# Patient Record
Sex: Female | Born: 1995 | Race: Black or African American | Hispanic: No | Marital: Married | Smoking: Never smoker
Health system: Southern US, Community
[De-identification: ages and names within clinical notes are randomized; demographics above are authoritative.]

## PROBLEM LIST (undated history)

## (undated) DIAGNOSIS — R55 Syncope and collapse: Secondary | ICD-10-CM

## (undated) DIAGNOSIS — Z3490 Encounter for supervision of normal pregnancy, unspecified, unspecified trimester: Secondary | ICD-10-CM

## (undated) HISTORY — PX: NO PAST SURGERIES: SHX2092

---
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
## 2014-03-06 ENCOUNTER — Other Ambulatory Visit: Payer: Self-pay | Admitting: Obstetrics and Gynecology

## 2014-03-06 DIAGNOSIS — N649 Disorder of breast, unspecified: Secondary | ICD-10-CM

## 2014-03-12 ENCOUNTER — Ambulatory Visit
Admission: RE | Admit: 2014-03-12 | Discharge: 2014-03-12 | Disposition: A | Discharge status: Home / Self Care | Source: Ambulatory Visit | Attending: Obstetrics and Gynecology | Admitting: Obstetrics and Gynecology

## 2014-03-12 DIAGNOSIS — N649 Disorder of breast, unspecified: Secondary | ICD-10-CM

## 2014-09-19 IMAGING — CR DG CHEST 2V
2 series · 2 of 2 positions shown · non-contrast
Comparison: 07/06/2011

CLINICAL DATA: Fever and body aches

EXAM:
CHEST  2 VIEW

[w chest pa]
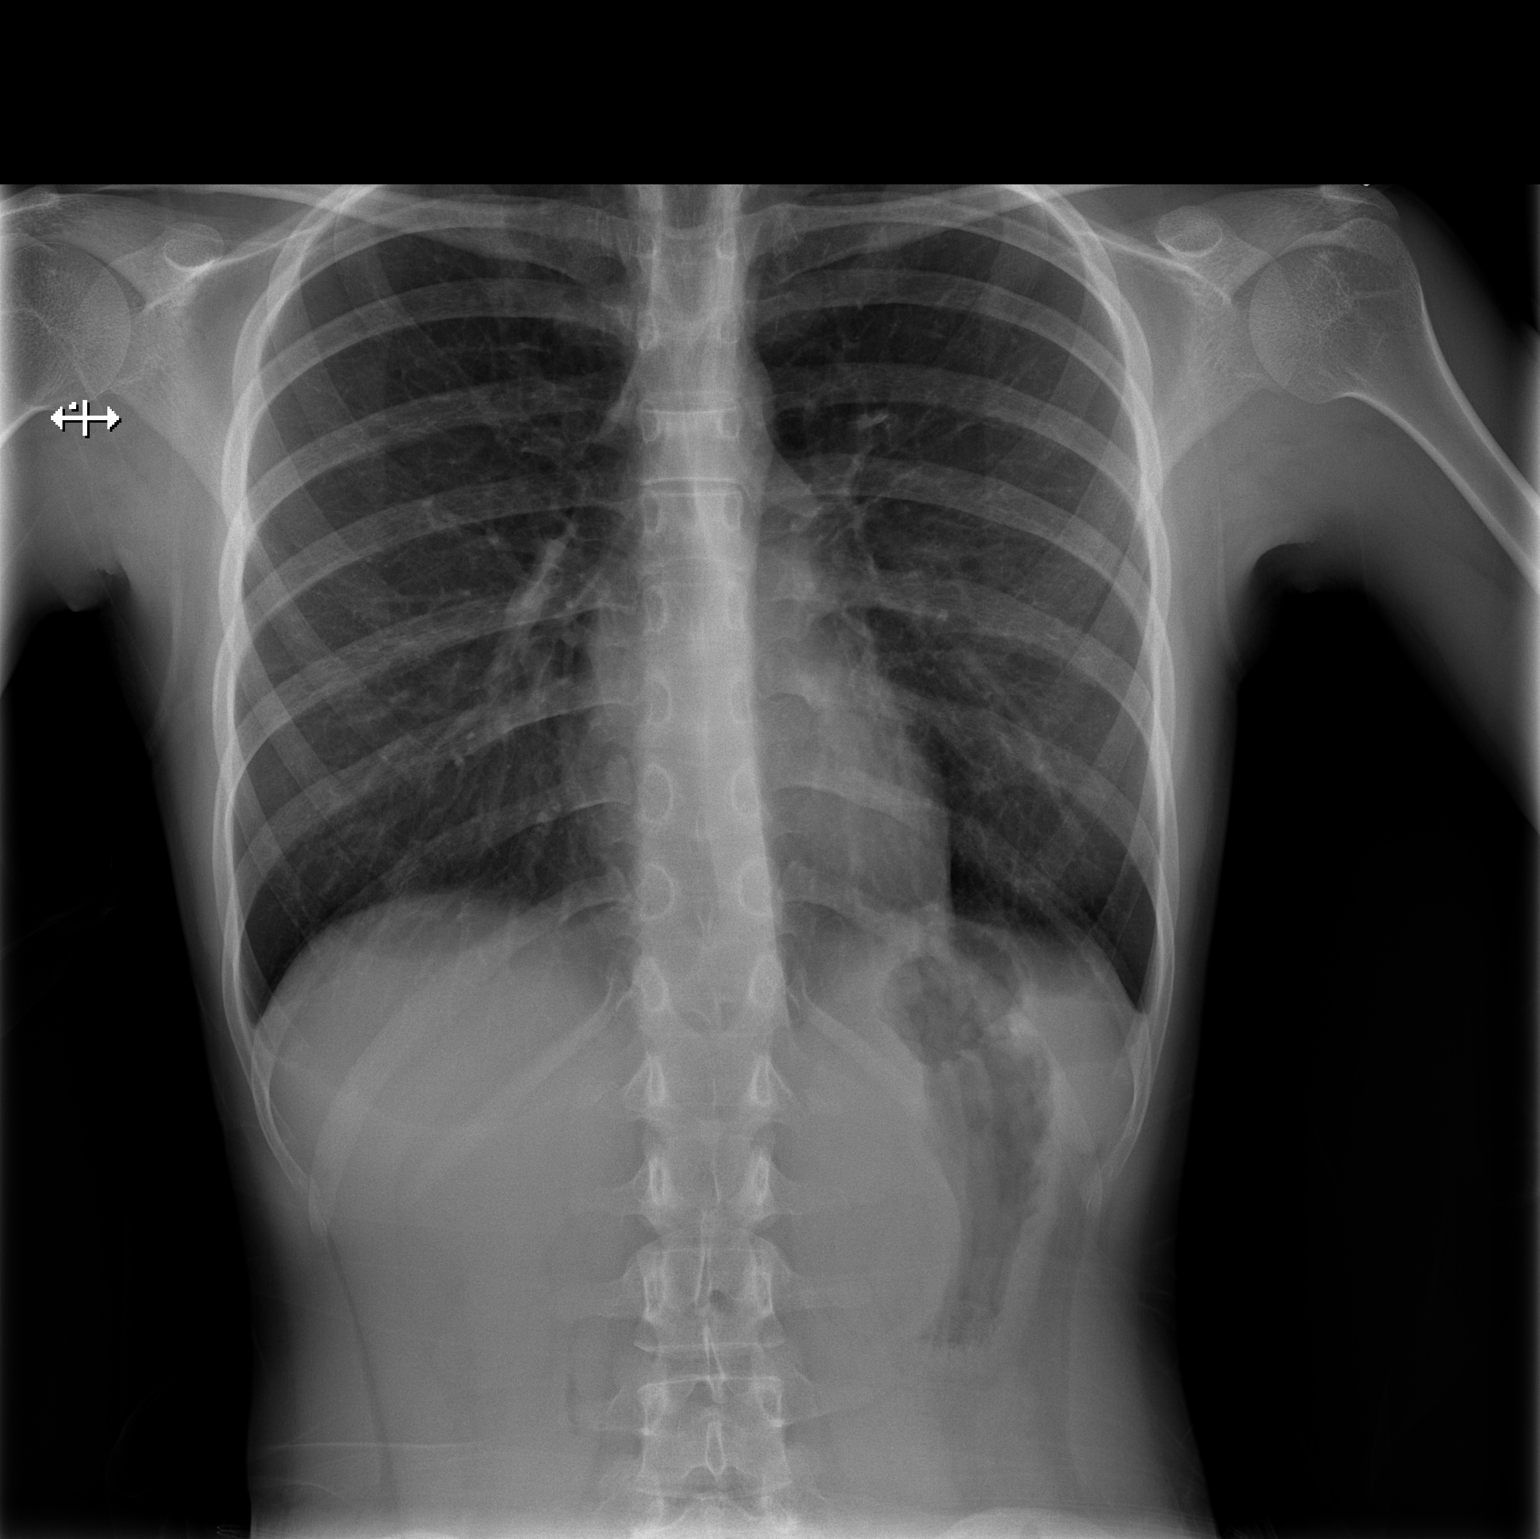

[w chest lat]
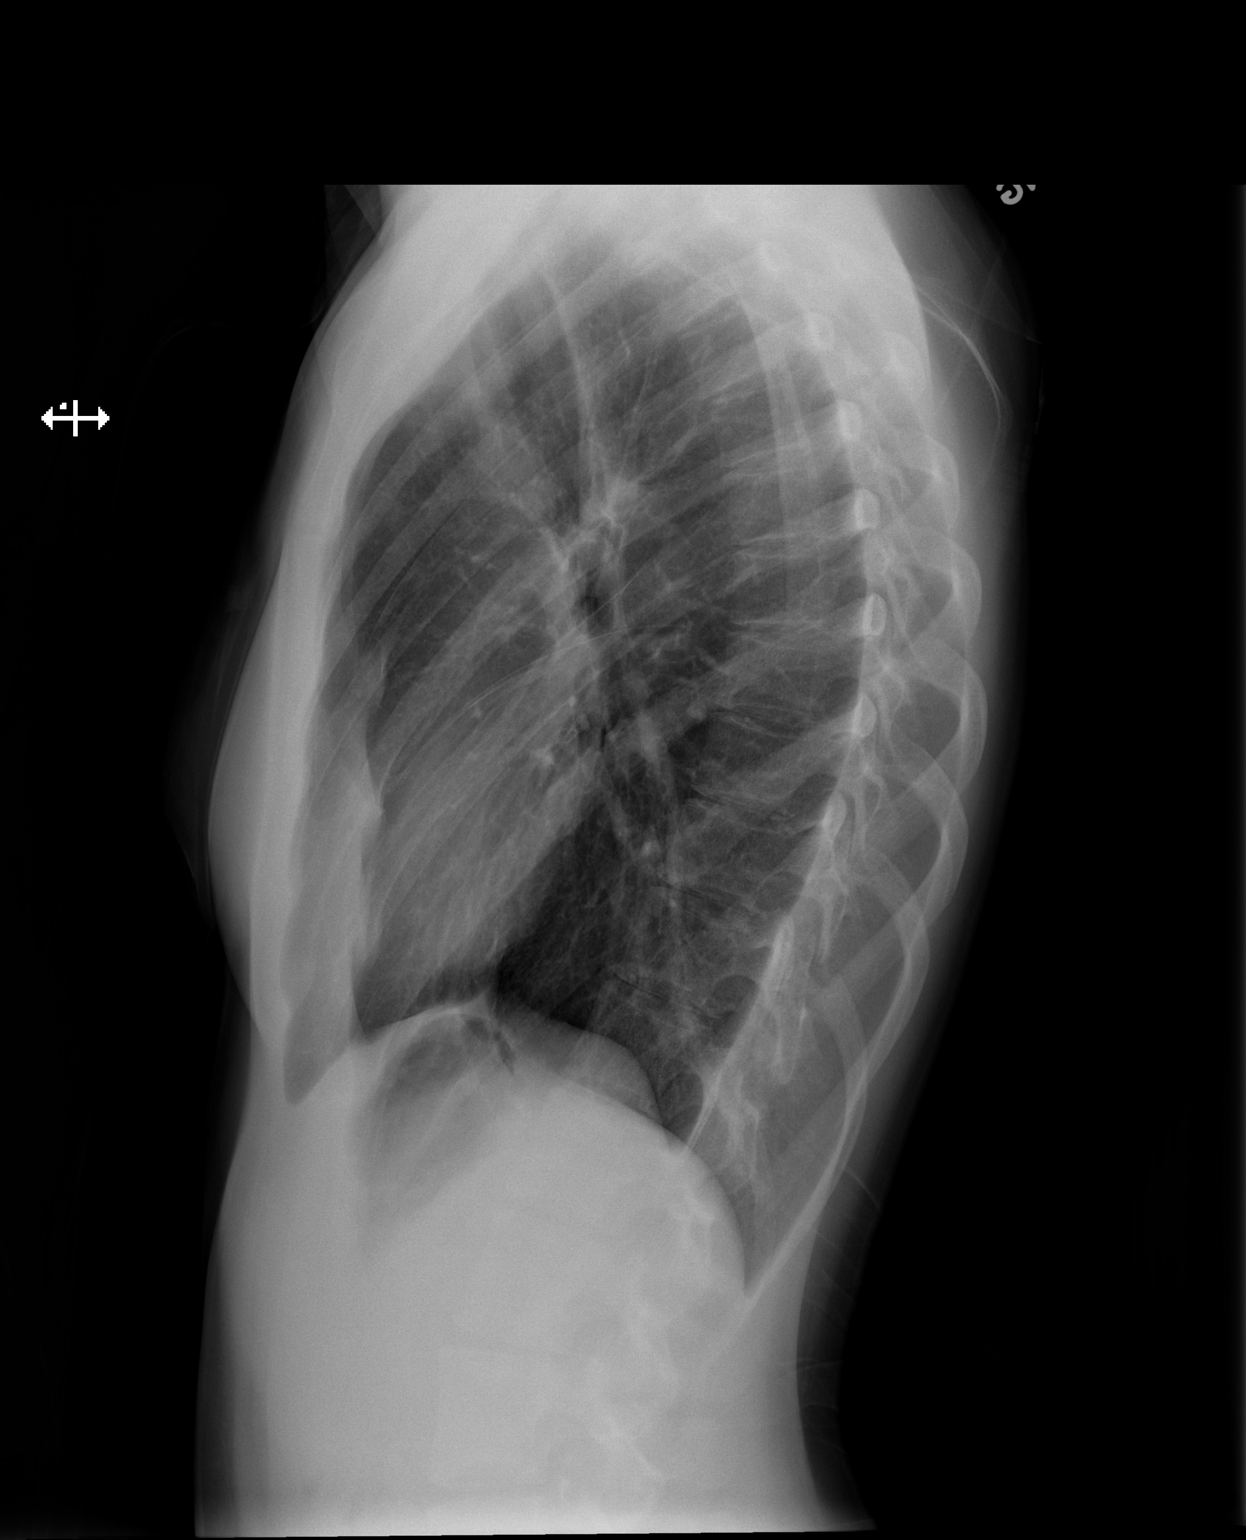

[2 of 2 positions shown; findings below may reference images not displayed]

FINDINGS: The heart size and mediastinal contours are within normal limits.
Both lungs are clear. The visualized skeletal structures are
unremarkable.
IMPRESSION: No active cardiopulmonary disease.

## 2014-09-24 ENCOUNTER — Encounter (HOSPITAL_COMMUNITY): Payer: Self-pay | Admitting: Emergency Medicine

## 2014-09-24 DIAGNOSIS — J029 Acute pharyngitis, unspecified: Secondary | ICD-10-CM | POA: Diagnosis not present

## 2014-09-24 DIAGNOSIS — Z880 Allergy status to penicillin: Secondary | ICD-10-CM | POA: Diagnosis not present

## 2014-09-24 NOTE — ED Notes (Signed)
Pt. reports sore throat " hard to swallow" onset today , denies fever or chills , no cough , airway intact/respirations unlabored.

## 2014-09-25 ENCOUNTER — Emergency Department (HOSPITAL_COMMUNITY)
Admission: EM | Admit: 2014-09-25 | Discharge: 2014-09-25 | Disposition: A | Discharge status: Home / Self Care | Attending: Emergency Medicine | Admitting: Emergency Medicine

## 2014-09-25 DIAGNOSIS — J029 Acute pharyngitis, unspecified: Secondary | ICD-10-CM

## 2014-09-25 LAB — RAPID STREP SCREEN (MED CTR MEBANE ONLY): Streptococcus, Group A Screen (Direct): NEGATIVE

## 2014-09-25 MED ORDER — IBUPROFEN 600 MG PO TABS: 600.0000 mg | ORAL_TABLET | Freq: Four times a day (QID) | ORAL | Status: DC | PRN

## 2014-09-25 MED ORDER — IBUPROFEN 400 MG PO TABS
600.0000 mg | ORAL_TABLET | Freq: Once | ORAL | Status: AC
  Administered 2014-09-25: 600 mg via ORAL
  Filled 2014-09-25 (×2): qty 1

## 2014-09-25 NOTE — Discharge Instructions (Signed)
Your strep test is negative.  Please take Tylenol or ibuprofen on a regular basis.  He can alternate both of these medicines every 3 hours so that you do not have a long period of time without temperature or pain related drink plenty of fluids

## 2014-09-25 NOTE — ED Notes (Signed)
NP to see PT before RN assessment.

## 2014-09-25 NOTE — ED Provider Notes (Signed)
CSN: [id number]     Arrival date & time 09/24/14  2320 History   First MD Initiated Contact with Patient 09/25/14 0050     Chief Complaint  Patient presents with  . Sore Throat     (Consider location/radiation/quality/duration/timing/severity/associated sxs/prior Treatment) HPI Comments: Patient reports that she's had a sore throat and painful swallowing since yesterday.  She's tried over-the-counter Tylenol without any relief.  She denies any nausea, vomiting, diarrhea, URI symptoms, cough.   Patient is a 19 y.o. female presenting with pharyngitis. The history is provided by the patient.  Sore Throat This is a new problem. The current episode started yesterday. The problem has been unchanged. Associated symptoms include myalgias, a sore throat and swollen glands. Pertinent negatives include no abdominal pain, chest pain, chills, coughing, diaphoresis, fever, headaches, nausea, neck pain, rash or urinary symptoms. The symptoms are aggravated by swallowing. She has tried acetaminophen for the symptoms.    Past Medical History  Diagnosis Date  . Syncope    History reviewed. No pertinent past surgical history. No family history on file. Social History  Substance Use Topics  . Smoking status: Never Smoker   . Smokeless tobacco: None  . Alcohol Use: No   OB History    No data available     Review of Systems  Constitutional: Negative for fever, chills and diaphoresis.  HENT: Positive for sore throat.   Respiratory: Positive for shortness of breath. Negative for cough.   Cardiovascular: Negative for chest pain.  Gastrointestinal: Negative for nausea and abdominal pain.  Musculoskeletal: Positive for myalgias. Negative for neck pain.  Skin: Negative for rash.  Neurological: Negative for headaches.      Allergies  Penicillins  Home Medications   Prior to Admission medications   Medication Sig Start Date End Date Taking? Authorizing Provider  etonogestrel (NEXPLANON) 68 MG  IMPL implant Inject 1 each into the skin once.    Historical Provider, MD  ibuprofen (ADVIL,MOTRIN) 600 MG tablet Take 1 tablet (600 mg total) by mouth every 6 (six) hours as needed. 09/25/14   Earley Favor, NP   BP 117/61 mmHg  Pulse 98  Temp(Src) 99.2 F (37.3 C) (Oral)  Resp 14  Ht  (1.626 m)  Wt 115 lb (52.164 kg)  BMI 19.73 kg/m2  SpO2 100% Physical Exam  Constitutional: She appears well-developed and well-nourished.  HENT:  Head: Normocephalic.  Right Ear: External ear normal.  Left Ear: External ear normal.  Mouth/Throat: Uvula is midline. Posterior oropharyngeal erythema present. No oropharyngeal exudate, posterior oropharyngeal edema or tonsillar abscesses.  Eyes: Pupils are equal, round, and reactive to light.  Neck: Normal range of motion.  Cardiovascular: Normal rate.   Pulmonary/Chest: Effort normal.  Abdominal: Soft. She exhibits no distension. There is no tenderness.  Lymphadenopathy:    She has cervical adenopathy.  Skin: Skin is warm and dry.  Nursing note and vitals reviewed.   ED Course  Procedures (including critical care time) Labs Review Labs Reviewed  RAPID STREP SCREEN (NOT AT [facility])  CULTURE, GROUP A STREP    Imaging Review No results found.   EKG Interpretation None      MDM   Final diagnoses:  Pharyngitis         Earley Favor, NP 09/25/14 0102  Lyndal Pulley, MD 09/25/14 [phone]

## 2014-09-27 LAB — CULTURE, GROUP A STREP

## 2015-03-03 ENCOUNTER — Other Ambulatory Visit: Payer: Self-pay | Admitting: Obstetrics and Gynecology

## 2015-03-03 DIAGNOSIS — N6320 Unspecified lump in the left breast, unspecified quadrant: Secondary | ICD-10-CM

## 2015-03-16 ENCOUNTER — Other Ambulatory Visit

## 2015-03-19 ENCOUNTER — Ambulatory Visit
Admission: RE | Admit: 2015-03-19 | Discharge: 2015-03-19 | Disposition: A | Discharge status: Home / Self Care | Source: Ambulatory Visit | Attending: Obstetrics and Gynecology | Admitting: Obstetrics and Gynecology

## 2015-03-19 DIAGNOSIS — N6320 Unspecified lump in the left breast, unspecified quadrant: Secondary | ICD-10-CM
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
## 2018-02-16 ENCOUNTER — Other Ambulatory Visit: Payer: Self-pay

## 2018-02-16 ENCOUNTER — Encounter (HOSPITAL_COMMUNITY): Payer: Self-pay

## 2018-02-16 ENCOUNTER — Emergency Department (HOSPITAL_COMMUNITY)
Admission: EM | Admit: 2018-02-16 | Discharge: 2018-02-17 | Disposition: A | Discharge status: Home / Self Care | Payer: Medicaid Other | Attending: Emergency Medicine | Admitting: Emergency Medicine

## 2018-02-16 DIAGNOSIS — R51 Headache: Secondary | ICD-10-CM | POA: Insufficient documentation

## 2018-02-16 DIAGNOSIS — Z5321 Procedure and treatment not carried out due to patient leaving prior to being seen by health care provider: Secondary | ICD-10-CM | POA: Diagnosis not present
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
## 2018-02-17 ENCOUNTER — Encounter (HOSPITAL_COMMUNITY): Payer: Self-pay

## 2018-02-17 ENCOUNTER — Emergency Department (HOSPITAL_COMMUNITY)
Admission: EM | Admit: 2018-02-17 | Discharge: 2018-02-17 | Disposition: A | Discharge status: Home / Self Care | Payer: Medicaid Other | Source: Home / Self Care | Attending: Emergency Medicine | Admitting: Emergency Medicine

## 2018-02-17 DIAGNOSIS — Z041 Encounter for examination and observation following transport accident: Secondary | ICD-10-CM

## 2018-02-17 DIAGNOSIS — Z79899 Other long term (current) drug therapy: Secondary | ICD-10-CM | POA: Insufficient documentation

## 2018-02-17 HISTORY — DX: Encounter for supervision of normal pregnancy, unspecified, unspecified trimester: Z34.90

## 2018-02-17 NOTE — Discharge Instructions (Signed)
Return if any problems.  Prenatal care as scheduled
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
## 2018-02-17 NOTE — ED Notes (Signed)
Pt called multiple times for vitals assessment with no answer.

## 2018-02-17 NOTE — ED Notes (Signed)
Declined W/C at D/C and was escorted to lobby by RN. 

## 2018-03-09 ENCOUNTER — Inpatient Hospital Stay (HOSPITAL_COMMUNITY)
Admission: AD | Admit: 2018-03-09 | Discharge: 2018-03-09 | Disposition: A | Discharge status: Home / Self Care | Payer: Medicaid Other | Attending: Obstetrics and Gynecology | Admitting: Obstetrics and Gynecology

## 2018-03-09 ENCOUNTER — Inpatient Hospital Stay (HOSPITAL_COMMUNITY): Payer: Medicaid Other

## 2018-03-09 ENCOUNTER — Encounter (HOSPITAL_COMMUNITY): Payer: Self-pay | Admitting: *Deleted

## 2018-03-09 DIAGNOSIS — O200 Threatened abortion: Secondary | ICD-10-CM | POA: Diagnosis not present

## 2018-03-09 DIAGNOSIS — Z880 Allergy status to penicillin: Secondary | ICD-10-CM | POA: Insufficient documentation

## 2018-03-09 DIAGNOSIS — O4691 Antepartum hemorrhage, unspecified, first trimester: Secondary | ICD-10-CM

## 2018-03-09 DIAGNOSIS — O039 Complete or unspecified spontaneous abortion without complication: Secondary | ICD-10-CM | POA: Diagnosis not present

## 2018-03-09 DIAGNOSIS — O4690 Antepartum hemorrhage, unspecified, unspecified trimester: Secondary | ICD-10-CM

## 2018-03-09 DIAGNOSIS — Z3A09 9 weeks gestation of pregnancy: Secondary | ICD-10-CM | POA: Diagnosis not present

## 2018-03-09 DIAGNOSIS — O209 Hemorrhage in early pregnancy, unspecified: Secondary | ICD-10-CM | POA: Diagnosis present

## 2018-03-09 LAB — WET PREP, GENITAL
Clue Cells Wet Prep HPF POC: NONE SEEN
Sperm: NONE SEEN
Trich, Wet Prep: NONE SEEN
YEAST WET PREP: NONE SEEN

## 2018-03-09 LAB — URINALYSIS, ROUTINE W REFLEX MICROSCOPIC
Bilirubin Urine: NEGATIVE
Glucose, UA: NEGATIVE mg/dL
Ketones, ur: NEGATIVE mg/dL
Leukocytes, UA: NEGATIVE
Nitrite: NEGATIVE
PROTEIN: NEGATIVE mg/dL
Specific Gravity, Urine: 1.014 (ref 1.005–1.030)
pH: 5 (ref 5.0–8.0)

## 2018-03-09 LAB — POCT PREGNANCY, URINE: PREG TEST UR: POSITIVE — AB

## 2018-03-09 LAB — CBC
HCT: 42.5 % (ref 36.0–46.0)
Hemoglobin: 13.7 g/dL (ref 12.0–15.0)
MCH: 28.4 pg (ref 26.0–34.0)
MCHC: 32.2 g/dL (ref 30.0–36.0)
MCV: 88 fL (ref 80.0–100.0)
Platelets: 267 10*3/uL (ref 150–400)
RBC: 4.83 MIL/uL (ref 3.87–5.11)
RDW: 13.1 % (ref 11.5–15.5)
WBC: 8.2 10*3/uL (ref 4.0–10.5)
nRBC: 0 % (ref 0.0–0.2)

## 2018-03-09 LAB — HCG, QUANTITATIVE, PREGNANCY: hCG, Beta Chain, Quant, S: 1814 m[IU]/mL — ABNORMAL HIGH (ref <5)

## 2018-03-09 LAB — ABO/RH: ABO/RH(D): O POS

## 2018-03-09 NOTE — MAU Provider Note (Signed)
[facility]ulty [facility] OB/GYN Attending MAU Note  Chief Complaint: Vaginal Bleeding    First Provider Initiated Contact with Patient 03/09/18 1513      SUBJECTIVE Donna Hebert is a 23 y.o. G1P0 at [redacted]w[redacted]d by LMP who presents with 3 d h/o bleeding. Noted reddish/brown discharge. Had a clot today. bleding has worsened today. Has not done anything for the bleeding.  Has not had any cramping. Denies,dysuria, fever, chills, limited nausea. Reports some breast tenderness. Notes LMP 11/20--sure, has calculator on phone, + home pregnancy test 12/19.  Past Medical History:  Diagnosis Date  . Pregnant   . Syncope    OB History  Gravida Para Term Preterm AB Living  1            SAB TAB Ectopic Multiple Live Births               # Outcome Date GA Lbr Len/2nd Weight Sex Delivery Anes PTL Lv  1 Current            Past Surgical History:  Procedure Laterality Date  . NO PAST SURGERIES     Social History   Socioeconomic History  . Marital status: Significant Other    Spouse name: Not on file  . Number of children: Not on file  . Years of education: Not on file  . Highest education level: Not on file  Occupational History  . Not on file  Social Needs  . Financial resource strain: Not on file  . Food insecurity:    Worry: Not on file    Inability: Not on file  . Transportation needs:    Medical: Not on file    Non-medical: Not on file  Tobacco Use  . Smoking status: Never Smoker  . Smokeless tobacco: Never Used  Substance and Sexual Activity  . Alcohol use: No  . Drug use: No  . Sexual activity: Not on file  Lifestyle  . Physical activity:    Days per week: Not on file    Minutes per session: Not on file  . Stress: Not on file  Relationships  . Social connections:    Talks on phone: Not on file    Gets together: Not on file    Attends religious service: Not on file    Active member of club or organization: Not on file    Attends meetings of clubs or organizations: Not on file     Relationship status: Not on file  . Intimate partner violence:    Fear of current or ex partner: Not on file    Emotionally abused: Not on file    Physically abused: Not on file    Forced sexual activity: Not on file  Other Topics Concern  . Not on file  Social History Narrative  . Not on file   No current facility-administered medications on file prior to encounter.    Current Outpatient Medications on File Prior to Encounter  Medication Sig Dispense Refill  . etonogestrel (NEXPLANON) 68 MG IMPL implant Inject 1 each into the skin once.    Marland Kitchen ibuprofen (ADVIL,MOTRIN) 600 MG tablet Take 1 tablet (600 mg total) by mouth every 6 (six) hours as needed. 30 tablet 0   Allergies  Allergen Reactions  . Penicillins Hives and Swelling    ROS: Pertinent items in HPI  OBJECTIVE BP 117/68   Pulse 86   Temp 98.2 F (36.8 C)   Resp 18   Ht 5\' 4"  (1.626 m)   Wt  54.9 kg   LMP 01/03/2018 (Exact Date)   BMI 20.77 kg/m  CONSTITUTIONAL: Well-developed, well-nourished female in no acute distress.  HENT:  Normocephalic, atraumatic, External right and left ear normal. Oropharynx is clear and moist EYES: Conjunctivae and EOM are normal.  No scleral icterus.  NECK: Normal range of motion, supple, no masses.  Normal thyroid.  SKIN: Skin is warm and dry. No rash noted.  NEUROLGIC: Alert and oriented to person, place, and time. PSYCHIATRIC: Normal mood and affect. Normal behavior. Normal judgment and thought content. CARDIOVASCULAR: Normal heart rate noted RESPIRATORY: Effort and breath sounds normal, no problems with respiration noted. ABDOMEN: Soft, normal bowel sounds, no distention noted.  No tenderness, rebound or guarding.  PELVIC: Normal appearing external genitalia; normal appearing vaginal mucosa and cervix.  Dark brown discharge, wet prep and pelvic cultures obtained. 6 wk uterine size, no other palpable masses, no uterine or adnexal tenderness. MUSCULOSKELETAL: Normal range of  motion. No tenderness.  No cyanosis, clubbing, or edema.  2+ distal pulses.  LAB RESULTS Results for orders placed or performed during the hospital encounter of 03/09/18 (from the past 48 hour(s))  Urinalysis, Routine w reflex microscopic     Status: Abnormal   Collection Time: 03/09/18  3:06 PM  Result Value Ref Range   Color, Urine YELLOW YELLOW   APPearance HAZY (A) CLEAR   Specific Gravity, Urine 1.014 1.005 - 1.030   pH 5.0 5.0 - 8.0   Glucose, UA NEGATIVE NEGATIVE mg/dL   Hgb urine dipstick LARGE (A) NEGATIVE   Bilirubin Urine NEGATIVE NEGATIVE   Ketones, ur NEGATIVE NEGATIVE mg/dL   Protein, ur NEGATIVE NEGATIVE mg/dL   Nitrite NEGATIVE NEGATIVE   Leukocytes, UA NEGATIVE NEGATIVE   RBC / HPF 6-10 0 - 5 RBC/hpf   WBC, UA 0-5 0 - 5 WBC/hpf   Bacteria, UA RARE (A) NONE SEEN   Squamous Epithelial / LPF 6-10 0 - 5   Mucus PRESENT     Comment: Performed at [facility], [address] [place], [place] [address]  Pregnancy, urine POC     Status: Abnormal   Collection Time: 03/09/18  3:08 PM  Result Value Ref Range   Preg Test, Ur POSITIVE (A) NEGATIVE    Comment:        THE SENSITIVITY OF THIS METHODOLOGY IS >24 mIU/mL   Wet prep, genital     Status: Abnormal   Collection Time: 03/09/18  3:42 PM  Result Value Ref Range   Yeast Wet Prep HPF POC NONE SEEN NONE SEEN   Trich, Wet Prep NONE SEEN NONE SEEN   Clue Cells Wet Prep HPF POC NONE SEEN NONE SEEN   WBC, Wet Prep HPF POC FEW (A) NONE SEEN   Sperm NONE SEEN     Comment: Performed at [facility], [address], [place], [place] [address]  ABO/Rh     Status: None (Preliminary result)   Collection Time: 03/09/18  3:50 PM  Result Value Ref Range   ABO/RH(D)      O POS Performed at [facility], [address] [place], [place] [address]   CBC     Status: None   Collection Time: 03/09/18  3:50 PM  Result Value Ref Range   WBC 8.2 4.0 - 10.5 K/uL   RBC 4.83 3.87 - 5.11 MIL/uL   Hemoglobin 13.7 12.0 -  15.0 g/dL   HCT 34.1 96.2 - 22.9 %   MCV 88.0 80.0 - 100.0 fL   MCH 28.4  26.0 - 34.0 pg   MCHC 32.2 30.0 - 36.0 g/dL   RDW 66.413.1 40.311.5 - 47.415.5 %   Platelets 267 150 - 400 K/uL   nRBC 0.0 0.0 - 0.2 %    Comment: Performed at [facility], [address] [place], [place] [address]  hCG, quantitative, pregnancy     Status: Abnormal   Collection Time: 03/09/18  3:50 PM  Result Value Ref Range   hCG, Beta Chain, Quant, S 1,814 (H) <5 mIU/mL    Comment:          GEST. AGE      CONC.  (mIU/mL)   <=1 WEEK        5 - 50     2 WEEKS       50 - 500     3 WEEKS       100 - 10,000     4 WEEKS     1,000 - 30,000     5 WEEKS     3,500 - 115,000   6-8 WEEKS     12,000 - 270,000    12 WEEKS     15,000 - 220,000        FEMALE AND NON-PREGNANT FEMALE:     LESS THAN 5 mIU/mL Performed at [facility], [address] [place], [place] [address]     IMAGING [place] Ob Less Than 14 Weeks With Ob Transvaginal  Result Date: 03/09/2018 CLINICAL DATA:  Vaginal bleeding in first trimester of pregnancy EXAM: OBSTETRIC <14 WK US AND TRANSVAGINAL OB US TECHNIQUE: Both transabdominal and transvaginal ultrasound examinations were performed for complete evaluation of the gestation as well as the maternal uterus, adnexal regions, and pelvic cul-de-sac. Transvaginal technique was performed to assess early pregnancy. COMPARISON:  None FINDINGS: Intrauterine gestational sac: Present, single Yolk sac:  Present Embryo:  Probably present Cardiac Activity: Not visualized Heart Rate: N/A  bpm MSD: 10.3 mm   5 w   5 d CRL:  2.3 mm   5 w   5 d                  US EDC: 11/04/2018 Subchorionic hemorrhage:  None identified Maternal uterus/adnexae: Retroverted uterus. RIGHT ovary normal size and morphology 2.3 x 2.2 x 2.0 cm. LEFT ovary normal size and morphology 2.0 x 1.1 x 1.4 cm. Trace free pelvic fluid. No adnexal masses. IMPRESSION: Gestational sac identified within the uterus containing a yolk sac and a probable fetal pole. No  fetal cardiac activity is detected to establish viability. Consider follow-up ultrasound in 10-14 days to establish viability if clinically indicated. Electronically Signed   By: Ulyses SouthwardMark  Boles M.D.   On: 03/09/2018 17:00    MAU COURSE  ASSESSMENT 1. Threatened abortion in first trimester   2. Vaginal bleeding during pregnancy, antepartum   suspect this will be a failed pregnancy given her sureness of dates and size of pregnancy, though this cannot be confirmed today  PLAN Discharge home F/u with OB provider Recommend u/s for viability in 7-10 days Bleeding precautions provided What to expect, causes, lack of meaningful treatment options, discussed at length. Follow-up Information    [facility] Follow [facility].   Contact information: [address]  [address] [place] [address] [phone]          Allergies as of 03/09/2018      Reactions   Penicillins Hives, Swelling      Medication List    STOP taking these  medications   ibuprofen 600 MG tablet Commonly known as:  ADVIL,MOTRIN   NEXPLANON 68 MG Impl implant Generic drug:  etonogestrel        Reva BoresPratt, Tanya S, MD 03/09/2018 5:23 PM

## 2018-03-09 NOTE — MAU Note (Signed)
Pt reports she has been having spotting for 3 days. Denies any pain or cramping.

## 2018-03-09 NOTE — Discharge Instructions (Signed)
Threatened Miscarriage  A threatened miscarriage occurs when a woman has vaginal bleeding during the first 20 weeks of pregnancy but the pregnancy has not ended. If you have vaginal bleeding during this time, your health care provider will do tests to make sure you are still pregnant. If the tests show that you are still pregnant and that the developing baby (fetus) inside your uterus is still growing, your condition is considered a threatened miscarriage. A threatened miscarriage does not mean your pregnancy will end, but it does increase the risk of losing your pregnancy (complete miscarriage). What are the causes? The cause of this condition is usually not known. For women who go on to have a complete miscarriage, the most common cause is an abnormal number of chromosomes in the developing baby. Chromosomes are the structures inside cells that hold all of a person's genetic material. What increases the risk? The following lifestyle factors may increase your risk of a miscarriage in early pregnancy:  Smoking.  Drinking excessive amounts of alcohol or caffeine.  Recreational drug use. The following preexisting health conditions may increase your risk of a miscarriage in early pregnancy:  Polycystic ovary syndrome.  Uterine fibroids.  Infections.  Diabetes mellitus. What are the signs or symptoms? Symptoms of this condition include:  Vaginal bleeding.  Mild abdominal pain or cramps. How is this diagnosed? If you have bleeding with or without abdominal pain before 20 weeks of pregnancy, your health care provider will do tests to check whether you are still pregnant. These will include:  Ultrasound. This test uses sound waves to create images of the inside of your uterus. This allows your health care provider to look at your developing baby and other structures, such as your placenta.  Pelvic exam. This is an internal exam of your vagina and cervix.  Measurement of your baby's heart  rate.  Laboratory tests such as blood tests, urine tests, or swabs for infection You may be diagnosed with a threatened miscarriage if:  Ultrasound testing shows that you are still pregnant.  Your baby's heart rate is strong.  A pelvic exam shows that the opening between your uterus and your vagina (cervix) is closed.  Blood tests confirm that you are still pregnant. How is this treated? No treatments have been shown to prevent a threatened miscarriage from going on to a complete miscarriage. However, the right home care is important. Follow these instructions at home:  Get plenty of rest.  Do not have sex or use tampons if you have vaginal bleeding.  Do not douche.  Do not smoke or use recreational drugs.  Do not drink alcohol.  Avoid caffeine.  Keep all follow-up prenatal visits as told by your health care provider. This is important. Contact a health care provider if:  You have light vaginal bleeding or spotting while pregnant.  You have abdominal pain or cramping.  You have a fever. Get help right away if:  You have heavy vaginal bleeding.  You have blood clots coming from your vagina.  You pass tissue from your vagina.  You leak fluid, or you have a gush of fluid from your vagina.  You have severe low back pain or abdominal cramps.  You have fever, chills, and severe abdominal pain. Summary  A threatened miscarriage occurs when a woman has vaginal bleeding during the first 20 weeks of pregnancy but the pregnancy has not ended.  The cause of a threatened miscarriage is usually not known.  Symptoms of this condition may   include vaginal bleeding and mild abdominal pain or cramps.  No treatments have been shown to prevent a threatened miscarriage from going on to a complete miscarriage.  Keep all follow-up prenatal visits as told by your health care provider. This is important. This information is not intended to replace advice given to you by your health  care provider. Make sure you discuss any questions you have with your health care provider. Document Released: 01/31/2005 Document Revised: 04/29/2016 Document Reviewed: 04/29/2016 Elsevier Interactive Patient Education  2019 Elsevier Inc.  

## 2018-03-10 ENCOUNTER — Inpatient Hospital Stay (HOSPITAL_COMMUNITY)
Admission: AD | Admit: 2018-03-10 | Discharge: 2018-03-10 | Disposition: A | Discharge status: Home / Self Care | Payer: Medicaid Other | Attending: Obstetrics and Gynecology | Admitting: Obstetrics and Gynecology

## 2018-03-10 ENCOUNTER — Inpatient Hospital Stay (HOSPITAL_COMMUNITY): Payer: Medicaid Other

## 2018-03-10 ENCOUNTER — Encounter (HOSPITAL_COMMUNITY): Payer: Self-pay

## 2018-03-10 DIAGNOSIS — Z880 Allergy status to penicillin: Secondary | ICD-10-CM | POA: Diagnosis not present

## 2018-03-10 DIAGNOSIS — O209 Hemorrhage in early pregnancy, unspecified: Secondary | ICD-10-CM | POA: Diagnosis present

## 2018-03-10 DIAGNOSIS — O4691 Antepartum hemorrhage, unspecified, first trimester: Secondary | ICD-10-CM | POA: Diagnosis not present

## 2018-03-10 DIAGNOSIS — O039 Complete or unspecified spontaneous abortion without complication: Secondary | ICD-10-CM | POA: Diagnosis not present

## 2018-03-10 DIAGNOSIS — Z3A09 9 weeks gestation of pregnancy: Secondary | ICD-10-CM | POA: Insufficient documentation

## 2018-03-10 DIAGNOSIS — O4690 Antepartum hemorrhage, unspecified, unspecified trimester: Secondary | ICD-10-CM

## 2018-03-10 DIAGNOSIS — Z6790 Unspecified blood type, Rh positive: Secondary | ICD-10-CM

## 2018-03-10 LAB — CBC
HEMATOCRIT: 42.6 % (ref 36.0–46.0)
Hemoglobin: 13.7 g/dL (ref 12.0–15.0)
MCH: 28.7 pg (ref 26.0–34.0)
MCHC: 32.2 g/dL (ref 30.0–36.0)
MCV: 89.1 fL (ref 80.0–100.0)
NRBC: 0 % (ref 0.0–0.2)
Platelets: 258 10*3/uL (ref 150–400)
RBC: 4.78 MIL/uL (ref 3.87–5.11)
RDW: 13.2 % (ref 11.5–15.5)
WBC: 9.2 10*3/uL (ref 4.0–10.5)

## 2018-03-10 LAB — URINALYSIS, ROUTINE W REFLEX MICROSCOPIC
BACTERIA UA: NONE SEEN
BILIRUBIN URINE: NEGATIVE
Glucose, UA: NEGATIVE mg/dL
Ketones, ur: NEGATIVE mg/dL
Leukocytes, UA: NEGATIVE
NITRITE: NEGATIVE
Protein, ur: NEGATIVE mg/dL
RBC / HPF: >50 RBC/hpf — ABNORMAL HIGH (ref 0–5)
Specific Gravity, Urine: 1.015 (ref 1.005–1.030)
pH: 7 (ref 5.0–8.0)

## 2018-03-10 LAB — HCG, QUANTITATIVE, PREGNANCY: hCG, Beta Chain, Quant, S: 1119 m[IU]/mL — ABNORMAL HIGH (ref <5)

## 2018-03-10 NOTE — MAU Provider Note (Signed)
History     CSN: [id number]  Arrival date and time: 03/10/18 1535  Chief Complaint  Patient presents with  . Abdominal Pain  . Vaginal Bleeding  . Nausea   G1 @[redacted]w[redacted]d  by sure LMP presenting with increased VB and new onset low abd cramping. Was seen yesterday in MAU for VB and US showed FP less than dates and no cardiac activity. After she left yesterday the bleeding became red and heavier requiring a pad. Today she started having low abd cramping. Rates 4/10. Has not taken anything for it.    OB History    Gravida  1   Para      Term      Preterm      AB      Living        SAB      TAB      Ectopic      Multiple      Live Births              Past Medical History:  Diagnosis Date  . Pregnant   . Syncope     Past Surgical History:  Procedure Laterality Date  . NO PAST SURGERIES      Family History  Problem Relation Age of Onset  . Kidney disease Mother   . Thyroid cancer Maternal Grandmother     Social History   Tobacco Use  . Smoking status: Never Smoker  . Smokeless tobacco: Never Used  Substance Use Topics  . Alcohol use: No  . Drug use: No    Allergies:  Allergies  Allergen Reactions  . Penicillins Hives and Swelling    No medications prior to admission.    Review of Systems  Constitutional: Negative for chills and fever.  Gastrointestinal: Positive for abdominal pain.  Genitourinary: Positive for vaginal bleeding.   Physical Exam   Blood pressure 109/65, pulse 92, temperature 98.2 F (36.8 C), temperature source Oral, resp. rate 17, weight 54.9 kg, last menstrual period 01/03/2018, SpO2 100 %.  Physical Exam  Nursing note and vitals reviewed. Constitutional: She is oriented to person, place, and time. She appears well-developed and well-nourished.  HENT:  Head: Normocephalic and atraumatic.  Neck: Normal range of motion.  Respiratory: Effort normal. No respiratory distress.  Genitourinary:    Genitourinary Comments:  External: no lesions or erythema Vagina: rugated, pink, moist, small bloody discharge, POCs in vault, removed with ring forcep Cervix closed    Musculoskeletal: Normal range of motion.  Neurological: She is alert and oriented to person, place, and time.  Skin: Skin is warm and dry.  Psychiatric: She has a normal mood and affect.   Results for orders placed or performed during the hospital encounter of 03/10/18 (from the past 24 hour(s))  Urinalysis, Routine w reflex microscopic     Status: Abnormal   Collection Time: 03/10/18  4:00 PM  Result Value Ref Range   Color, Urine YELLOW YELLOW   APPearance CLOUDY (A) CLEAR   Specific Gravity, Urine 1.015 1.005 - 1.030   pH 7.0 5.0 - 8.0   Glucose, UA NEGATIVE NEGATIVE mg/dL   Hgb urine dipstick LARGE (A) NEGATIVE   Bilirubin Urine NEGATIVE NEGATIVE   Ketones, ur NEGATIVE NEGATIVE mg/dL   Protein, ur NEGATIVE NEGATIVE mg/dL   Nitrite NEGATIVE NEGATIVE   Leukocytes, UA NEGATIVE NEGATIVE   RBC / HPF >50 (H) 0 - 5 RBC/hpf   Bacteria, UA NONE SEEN NONE SEEN   Squamous Epithelial / LPF  0-5 0 - 5   Mucus PRESENT   CBC     Status: None   Collection Time: 03/10/18  4:09 PM  Result Value Ref Range   WBC 9.2 4.0 - 10.5 K/uL   RBC 4.78 3.87 - 5.11 MIL/uL   Hemoglobin 13.7 12.0 - 15.0 g/dL   HCT 20.1 00.7 - 12.1 %   MCV 89.1 80.0 - 100.0 fL   MCH 28.7 26.0 - 34.0 pg   MCHC 32.2 30.0 - 36.0 g/dL   RDW 97.5 88.3 - 25.4 %   Platelets 258 150 - 400 K/uL   nRBC 0.0 0.0 - 0.2 %  hCG, quantitative, pregnancy     Status: Abnormal   Collection Time: 03/10/18  4:09 PM  Result Value Ref Range   hCG, Beta Chain, Quant, S 1,119 (H) <5 mIU/mL   US Ob Transvaginal  Result Date: 03/10/2018 CLINICAL DATA:  Vaginal bleeding.  Declining beta HCG level. EXAM: TRANSVAGINAL OB ULTRASOUND TECHNIQUE: Transvaginal ultrasound was performed for complete evaluation of the gestation as well as the maternal uterus, adnexal regions, and pelvic cul-de-sac. COMPARISON:   03/09/2018 FINDINGS: Intrauterine gestational sac: None Maternal uterus/adnexae: Minimal fluid or blood noted in the endocervical canal. No fibroids identified. Both ovaries are normal appearance. No mass or abnormal free fluid identified. IMPRESSION: Previous intrauterine gestational sac no longer visualized, consistent with interval spontaneous abortion. Electronically Signed   By: Myles Rosenthal M.D.   On: 03/10/2018 18:34   MAU Course  Procedures Orders Placed This Encounter  Procedures  . US OB Transvaginal    Standing Status:   Standing    Number of Occurrences:   1    Order Specific Question:   What location should the exam be performed?    Answer:   [id number]    Order Specific Question:   Symptom/Reason for Exam    Answer:   Vaginal bleeding in pregnancy [705036]  . Urinalysis, Routine w reflex microscopic    Standing Status:   Standing    Number of Occurrences:   1  . CBC    Standing Status:   Standing    Number of Occurrences:   1  . hCG, quantitative, pregnancy    Standing Status:   Standing    Number of Occurrences:   1  . Discharge patient    Order Specific Question:   Discharge disposition    Answer:   01-Home or Self Care [1]    Order Specific Question:   Discharge patient date    Answer:   03/10/2018   MDM Labs and [place] ordered and reviewed. Hgb stable. No retained POC. Discussed findnings of SAB with pt, support offered. POcs to pathology. Stable for discharge home.   Assessment and Plan   1. SAB (spontaneous abortion)   2. Vaginal bleeding in pregnancy   3. Blood type, Rh positive    Discharge home Follow up at [facility] in 2 weeks Bleeding/return precautions Pelvic rest Ibuprofen prn  Allergies as of 03/10/2018      Reactions   Penicillins Hives, Swelling      Medication List    You have not been prescribed any medications.     Donette Larry, CNM 03/10/2018, 7:06 PM

## 2018-03-10 NOTE — Discharge Instructions (Signed)
Miscarriage  A miscarriage is the loss of an unborn baby (fetus) before the 20th week of pregnancy.  Follow these instructions at home:  Medicines    · Take over-the-counter and prescription medicines only as told by your doctor.  · If you were prescribed antibiotic medicine, take it as told by your doctor. Do not stop taking the antibiotic even if you start to feel better.  · Do not take NSAIDs unless your doctor says that this is safe for you. NSAIDs include aspirin and ibuprofen. These medicines can cause bleeding.  Activity  · Rest as directed. Ask your doctor what activities are safe for you.  · Have someone help you at home during this time.  General instructions  · Write down how many pads you use each day and how soaked they are.  · Watch the amount of tissue or clumps of blood (blood clots) that you pass from your vagina. Save any large amounts of tissue for your doctor.  · Do not use tampons, douche, or have sex until your doctor approves.  · To help you and your partner with the process of grieving, talk with your doctor or seek counseling.  · When you are ready, meet with your doctor to talk about steps you should take for your health. Also, talk with your doctor about steps to take to have a healthy pregnancy in the future.  · Keep all follow-up visits as told by your doctor. This is important.  Contact a doctor if:  · You have a fever or chills.  · You have vaginal discharge that smells bad.  · You have more bleeding.  Get help right away if:  · You have very bad cramps or pain in your back or belly.  · You pass clumps of blood that are walnut-sized or larger from your vagina.  · You pass tissue that is walnut-sized or larger from your vagina.  · You soak more than 1 regular pad in an hour.  · You get light-headed or weak.  · You faint (pass out).  · You have feelings of sadness that do not go away, or you have thoughts of hurting yourself.  Summary  · A miscarriage is the loss of an unborn baby before  the 20th week of pregnancy.  · Follow your doctor's instructions for home care. Keep all follow-up appointments.  · To help you and your partner with the process of grieving, talk with your doctor or seek counseling.  This information is not intended to replace advice given to you by your health care provider. Make sure you discuss any questions you have with your health care provider.  Document Released: 04/25/2011 Document Revised: 03/08/2016 Document Reviewed: 03/08/2016  Elsevier Interactive Patient Education © 2019 [employer]

## 2018-03-10 NOTE — MAU Note (Signed)
Donna Hebert is a 23 y.o. at [redacted]w[redacted]d here in MAU reporting:  +vaginal bleeding with clots the size of dime. +lower abdominal cramping. Intermittent +nausea States was seen yesterday and came back today because of the increased symptoms. Onset of complaint: symptoms started yesterday but got worse about 2 hours ago Pain score: 4/10 Vitals:   03/10/18 1552  BP: 122/72  Pulse: 100  Resp: 17  Temp: 98.2 F (36.8 C)  SpO2: 100%    Lab orders placed from triage: ua

## 2018-03-12 LAB — GC/CHLAMYDIA PROBE AMP (~~LOC~~) NOT AT ARMC
Chlamydia: NEGATIVE
NEISSERIA GONORRHEA: NEGATIVE

## 2018-05-23 DIAGNOSIS — N911 Secondary amenorrhea: Secondary | ICD-10-CM | POA: Diagnosis not present

## 2018-05-23 DIAGNOSIS — Z3201 Encounter for pregnancy test, result positive: Secondary | ICD-10-CM | POA: Diagnosis not present

## 2018-06-14 DIAGNOSIS — Z3689 Encounter for other specified antenatal screening: Secondary | ICD-10-CM | POA: Diagnosis not present

## 2018-06-14 DIAGNOSIS — Z113 Encounter for screening for infections with a predominantly sexual mode of transmission: Secondary | ICD-10-CM | POA: Diagnosis not present

## 2018-06-14 DIAGNOSIS — Z1151 Encounter for screening for human papillomavirus (HPV): Secondary | ICD-10-CM | POA: Diagnosis not present

## 2018-06-14 DIAGNOSIS — Z3A09 9 weeks gestation of pregnancy: Secondary | ICD-10-CM | POA: Diagnosis not present

## 2018-06-14 DIAGNOSIS — R87612 Low grade squamous intraepithelial lesion on cytologic smear of cervix (LGSIL): Secondary | ICD-10-CM | POA: Diagnosis not present

## 2018-06-14 DIAGNOSIS — Z3682 Encounter for antenatal screening for nuchal translucency: Secondary | ICD-10-CM | POA: Diagnosis not present

## 2018-06-14 DIAGNOSIS — Z3481 Encounter for supervision of other normal pregnancy, first trimester: Secondary | ICD-10-CM | POA: Diagnosis not present

## 2018-06-14 DIAGNOSIS — Z368A Encounter for antenatal screening for other genetic defects: Secondary | ICD-10-CM | POA: Diagnosis not present

## 2018-06-14 DIAGNOSIS — O26891 Other specified pregnancy related conditions, first trimester: Secondary | ICD-10-CM | POA: Diagnosis not present

## 2018-06-27 DIAGNOSIS — Z3A11 11 weeks gestation of pregnancy: Secondary | ICD-10-CM | POA: Diagnosis not present

## 2018-06-27 DIAGNOSIS — O200 Threatened abortion: Secondary | ICD-10-CM | POA: Diagnosis not present

## 2018-07-10 DIAGNOSIS — Z3481 Encounter for supervision of other normal pregnancy, first trimester: Secondary | ICD-10-CM | POA: Diagnosis not present

## 2018-07-10 DIAGNOSIS — Z3682 Encounter for antenatal screening for nuchal translucency: Secondary | ICD-10-CM | POA: Diagnosis not present

## 2018-07-10 DIAGNOSIS — Z3A13 13 weeks gestation of pregnancy: Secondary | ICD-10-CM | POA: Diagnosis not present

## 2018-07-10 DIAGNOSIS — R87612 Low grade squamous intraepithelial lesion on cytologic smear of cervix (LGSIL): Secondary | ICD-10-CM | POA: Diagnosis not present

## 2018-07-31 DIAGNOSIS — R309 Painful micturition, unspecified: Secondary | ICD-10-CM | POA: Diagnosis not present

## 2018-08-20 DIAGNOSIS — Z363 Encounter for antenatal screening for malformations: Secondary | ICD-10-CM | POA: Diagnosis not present

## 2018-08-20 DIAGNOSIS — Z3A19 19 weeks gestation of pregnancy: Secondary | ICD-10-CM | POA: Diagnosis not present

## 2018-10-11 DIAGNOSIS — Z3A26 26 weeks gestation of pregnancy: Secondary | ICD-10-CM | POA: Diagnosis not present

## 2018-10-11 DIAGNOSIS — O368199 Decreased fetal movements, unspecified trimester, other fetus: Secondary | ICD-10-CM | POA: Diagnosis not present

## 2018-10-11 DIAGNOSIS — O368129 Decreased fetal movements, second trimester, other fetus: Secondary | ICD-10-CM | POA: Diagnosis not present

## 2018-10-24 DIAGNOSIS — Z3483 Encounter for supervision of other normal pregnancy, third trimester: Secondary | ICD-10-CM | POA: Diagnosis not present

## 2018-10-24 DIAGNOSIS — Z3A28 28 weeks gestation of pregnancy: Secondary | ICD-10-CM | POA: Diagnosis not present

## 2018-10-24 DIAGNOSIS — Z23 Encounter for immunization: Secondary | ICD-10-CM | POA: Diagnosis not present

## 2018-10-24 DIAGNOSIS — Z3689 Encounter for other specified antenatal screening: Secondary | ICD-10-CM | POA: Diagnosis not present

## 2018-11-08 DIAGNOSIS — R350 Frequency of micturition: Secondary | ICD-10-CM | POA: Diagnosis not present

## 2018-11-15 ENCOUNTER — Other Ambulatory Visit: Payer: Self-pay

## 2018-11-15 ENCOUNTER — Inpatient Hospital Stay (HOSPITAL_COMMUNITY): Payer: 59

## 2018-11-15 ENCOUNTER — Encounter (HOSPITAL_COMMUNITY): Payer: Self-pay | Admitting: *Deleted

## 2018-11-15 ENCOUNTER — Observation Stay (HOSPITAL_COMMUNITY)
Admission: AD | Admit: 2018-11-15 | Discharge: 2018-11-16 | Disposition: A | Discharge status: Home / Self Care | Payer: 59 | Attending: Obstetrics and Gynecology | Admitting: Obstetrics and Gynecology

## 2018-11-15 ENCOUNTER — Observation Stay (HOSPITAL_BASED_OUTPATIENT_CLINIC_OR_DEPARTMENT_OTHER): Payer: 59

## 2018-11-15 DIAGNOSIS — Z3689 Encounter for other specified antenatal screening: Secondary | ICD-10-CM

## 2018-11-15 DIAGNOSIS — Z20828 Contact with and (suspected) exposure to other viral communicable diseases: Secondary | ICD-10-CM | POA: Insufficient documentation

## 2018-11-15 DIAGNOSIS — Z3A31 31 weeks gestation of pregnancy: Secondary | ICD-10-CM | POA: Diagnosis not present

## 2018-11-15 DIAGNOSIS — O9A213 Injury, poisoning and certain other consequences of external causes complicating pregnancy, third trimester: Secondary | ICD-10-CM

## 2018-11-15 DIAGNOSIS — O4700 False labor before 37 completed weeks of gestation, unspecified trimester: Secondary | ICD-10-CM | POA: Diagnosis not present

## 2018-11-15 LAB — CBC
HCT: 36.4 % (ref 36.0–46.0)
Hemoglobin: 11.7 g/dL — ABNORMAL LOW (ref 12.0–15.0)
MCH: 28.8 pg (ref 26.0–34.0)
MCHC: 32.1 g/dL (ref 30.0–36.0)
MCV: 89.7 fL (ref 80.0–100.0)
Platelets: 173 10*3/uL (ref 150–400)
RBC: 4.06 MIL/uL (ref 3.87–5.11)
RDW: 13.5 % (ref 11.5–15.5)
WBC: 13 10*3/uL — ABNORMAL HIGH (ref 4.0–10.5)
nRBC: 0 % (ref 0.0–0.2)

## 2018-11-15 LAB — URINALYSIS, ROUTINE W REFLEX MICROSCOPIC
Bacteria, UA: NONE SEEN
Bilirubin Urine: NEGATIVE
Glucose, UA: NEGATIVE mg/dL
Hgb urine dipstick: NEGATIVE
Ketones, ur: NEGATIVE mg/dL
Leukocytes,Ua: NEGATIVE
Nitrite: NEGATIVE
Protein, ur: 30 mg/dL — AB
Specific Gravity, Urine: 1.027 (ref 1.005–1.030)
pH: 8 (ref 5.0–8.0)

## 2018-11-15 LAB — ABO/RH: ABO/RH(D): O POS

## 2018-11-15 LAB — SARS CORONAVIRUS 2 BY RT PCR (HOSPITAL ORDER, PERFORMED IN ~~LOC~~ HOSPITAL LAB): SARS Coronavirus 2: NEGATIVE

## 2018-11-15 LAB — TYPE AND SCREEN
ABO/RH(D): O POS
Antibody Screen: NEGATIVE

## 2018-11-15 MED ORDER — PRENATAL MULTIVITAMIN CH: 1.0000 | ORAL_TABLET | Freq: Every day | ORAL | Status: DC

## 2018-11-15 MED ORDER — ACETAMINOPHEN 325 MG PO TABS: 650.0000 mg | ORAL_TABLET | ORAL | Status: DC | PRN

## 2018-11-15 MED ORDER — LACTATED RINGERS IV BOLUS
500.0000 mL | Freq: Once | INTRAVENOUS | Status: AC
  Administered 2018-11-15: 500 mL via INTRAVENOUS

## 2018-11-15 MED ORDER — ZOLPIDEM TARTRATE 5 MG PO TABS: 5.0000 mg | ORAL_TABLET | Freq: Every evening | ORAL | Status: DC | PRN

## 2018-11-15 MED ORDER — DOCUSATE SODIUM 100 MG PO CAPS
100.0000 mg | ORAL_CAPSULE | Freq: Every day | ORAL | Status: DC
  Administered 2018-11-16: 100 mg via ORAL
  Filled 2018-11-15: qty 1

## 2018-11-15 MED ORDER — CALCIUM CARBONATE ANTACID 500 MG PO CHEW: 2.0000 | CHEWABLE_TABLET | ORAL | Status: DC | PRN

## 2018-11-15 MED ORDER — OXYTOCIN 10 UNIT/ML IJ SOLN: 10.0000 [IU] | Freq: Once | INTRAMUSCULAR | Status: DC

## 2018-11-15 MED ORDER — LACTATED RINGERS IV SOLN
INTRAVENOUS | Status: DC
  Administered 2018-11-15 – 2018-11-16 (×2): via INTRAVENOUS
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
## 2018-11-15 NOTE — Plan of Care (Signed)
  Problem: Education: Goal: Knowledge of disease or condition will improve Outcome: Progressing   
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
## 2018-12-13 ENCOUNTER — Inpatient Hospital Stay (HOSPITAL_COMMUNITY)
Admission: AD | Admit: 2018-12-13 | Discharge: 2018-12-15 | DRG: 805 | Disposition: A | Discharge status: Home / Self Care | Payer: 59 | Attending: Obstetrics and Gynecology | Admitting: Obstetrics and Gynecology

## 2018-12-13 ENCOUNTER — Encounter (HOSPITAL_COMMUNITY): Payer: Self-pay

## 2018-12-13 ENCOUNTER — Other Ambulatory Visit: Payer: Self-pay

## 2018-12-13 DIAGNOSIS — E119 Type 2 diabetes mellitus without complications: Secondary | ICD-10-CM | POA: Diagnosis present

## 2018-12-13 DIAGNOSIS — Z20828 Contact with and (suspected) exposure to other viral communicable diseases: Secondary | ICD-10-CM | POA: Diagnosis present

## 2018-12-13 DIAGNOSIS — O42013 Preterm premature rupture of membranes, onset of labor within 24 hours of rupture, third trimester: Secondary | ICD-10-CM

## 2018-12-13 DIAGNOSIS — Z794 Long term (current) use of insulin: Secondary | ICD-10-CM | POA: Diagnosis not present

## 2018-12-13 DIAGNOSIS — Z9641 Presence of insulin pump (external) (internal): Secondary | ICD-10-CM | POA: Diagnosis present

## 2018-12-13 DIAGNOSIS — Z3A35 35 weeks gestation of pregnancy: Secondary | ICD-10-CM

## 2018-12-13 DIAGNOSIS — Z880 Allergy status to penicillin: Secondary | ICD-10-CM | POA: Diagnosis not present

## 2018-12-13 DIAGNOSIS — O2432 Unspecified pre-existing diabetes mellitus in childbirth: Secondary | ICD-10-CM | POA: Diagnosis present

## 2018-12-13 DIAGNOSIS — O42913 Preterm premature rupture of membranes, unspecified as to length of time between rupture and onset of labor, third trimester: Principal | ICD-10-CM | POA: Diagnosis present

## 2018-12-13 DIAGNOSIS — O4290 Premature rupture of membranes, unspecified as to length of time between rupture and onset of labor, unspecified weeks of gestation: Secondary | ICD-10-CM | POA: Diagnosis present

## 2018-12-13 LAB — RPR: RPR Ser Ql: NONREACTIVE

## 2018-12-13 LAB — CBC
HCT: 41.2 % (ref 36.0–46.0)
Hemoglobin: 13 g/dL (ref 12.0–15.0)
MCH: 28.8 pg (ref 26.0–34.0)
MCHC: 31.6 g/dL (ref 30.0–36.0)
MCV: 91.2 fL (ref 80.0–100.0)
Platelets: 189 10*3/uL (ref 150–400)
RBC: 4.52 MIL/uL (ref 3.87–5.11)
RDW: 13.5 % (ref 11.5–15.5)
WBC: 12.4 10*3/uL — ABNORMAL HIGH (ref 4.0–10.5)
nRBC: 0 % (ref 0.0–0.2)

## 2018-12-13 LAB — TYPE AND SCREEN
ABO/RH(D): O POS
Antibody Screen: NEGATIVE

## 2018-12-13 LAB — POCT FERN TEST: POCT Fern Test: POSITIVE

## 2018-12-13 LAB — SARS CORONAVIRUS 2 BY RT PCR (HOSPITAL ORDER, PERFORMED IN ~~LOC~~ HOSPITAL LAB): SARS Coronavirus 2: NEGATIVE

## 2018-12-13 LAB — GROUP B STREP BY PCR: Group B strep by PCR: NEGATIVE

## 2018-12-13 MED ORDER — LACTATED RINGERS IV SOLN: 500.0000 mL | Freq: Once | INTRAVENOUS | Status: DC

## 2018-12-13 MED ORDER — OXYTOCIN 40 UNITS IN NORMAL SALINE INFUSION - SIMPLE MED: 2.5000 [IU]/h | INTRAVENOUS | Status: DC

## 2018-12-13 MED ORDER — DIPHENHYDRAMINE HCL 50 MG/ML IJ SOLN
INTRAMUSCULAR | Status: AC
  Administered 2018-12-13: 25 mg via INTRAVENOUS
  Filled 2018-12-13: qty 1

## 2018-12-13 MED ORDER — ONDANSETRON HCL 4 MG/2ML IJ SOLN: 4.0000 mg | INTRAMUSCULAR | Status: DC | PRN

## 2018-12-13 MED ORDER — OXYTOCIN 40 UNITS IN NORMAL SALINE INFUSION - SIMPLE MED
1.0000 m[IU]/min | INTRAVENOUS | Status: DC
  Administered 2018-12-13: 17:00:00 2 m[IU]/min via INTRAVENOUS
  Filled 2018-12-13: qty 1000

## 2018-12-13 MED ORDER — DIPHENHYDRAMINE HCL 50 MG/ML IJ SOLN: 12.5000 mg | INTRAMUSCULAR | Status: DC | PRN

## 2018-12-13 MED ORDER — SENNOSIDES-DOCUSATE SODIUM 8.6-50 MG PO TABS
2.0000 | ORAL_TABLET | ORAL | Status: DC
  Administered 2018-12-14: 2 via ORAL
  Filled 2018-12-13 (×2): qty 2

## 2018-12-13 MED ORDER — SIMETHICONE 80 MG PO CHEW: 80.0000 mg | CHEWABLE_TABLET | ORAL | Status: DC | PRN

## 2018-12-13 MED ORDER — IBUPROFEN 600 MG PO TABS
600.0000 mg | ORAL_TABLET | Freq: Four times a day (QID) | ORAL | Status: DC
  Administered 2018-12-14 – 2018-12-15 (×7): 600 mg via ORAL
  Filled 2018-12-13 (×8): qty 1

## 2018-12-13 MED ORDER — DIBUCAINE (PERIANAL) 1 % EX OINT: 1.0000 "application " | TOPICAL_OINTMENT | CUTANEOUS | Status: DC | PRN

## 2018-12-13 MED ORDER — EPHEDRINE 5 MG/ML INJ: 10.0000 mg | INTRAVENOUS | Status: DC | PRN

## 2018-12-13 MED ORDER — LACTATED RINGERS IV SOLN
500.0000 mL | INTRAVENOUS | Status: DC | PRN
  Administered 2018-12-13: 500 mL via INTRAVENOUS

## 2018-12-13 MED ORDER — DIPHENHYDRAMINE HCL 25 MG PO CAPS: 25.0000 mg | ORAL_CAPSULE | Freq: Four times a day (QID) | ORAL | Status: DC | PRN

## 2018-12-13 MED ORDER — ZOLPIDEM TARTRATE 5 MG PO TABS: 5.0000 mg | ORAL_TABLET | Freq: Every evening | ORAL | Status: DC | PRN

## 2018-12-13 MED ORDER — ACETAMINOPHEN 325 MG PO TABS: 650.0000 mg | ORAL_TABLET | ORAL | Status: DC | PRN

## 2018-12-13 MED ORDER — WITCH HAZEL-GLYCERIN EX PADS: 1.0000 "application " | MEDICATED_PAD | CUTANEOUS | Status: DC | PRN

## 2018-12-13 MED ORDER — OXYCODONE-ACETAMINOPHEN 5-325 MG PO TABS: 1.0000 | ORAL_TABLET | ORAL | Status: DC | PRN

## 2018-12-13 MED ORDER — TETANUS-DIPHTH-ACELL PERTUSSIS 5-2.5-18.5 LF-MCG/0.5 IM SUSP: 0.5000 mL | Freq: Once | INTRAMUSCULAR | Status: DC

## 2018-12-13 MED ORDER — COCONUT OIL OIL: 1.0000 "application " | TOPICAL_OIL | Status: DC | PRN

## 2018-12-13 MED ORDER — LIDOCAINE HCL (PF) 1 % IJ SOLN: 30.0000 mL | INTRAMUSCULAR | Status: DC | PRN

## 2018-12-13 MED ORDER — BETAMETHASONE SOD PHOS & ACET 6 (3-3) MG/ML IJ SUSP
12.0000 mg | INTRAMUSCULAR | Status: DC
  Administered 2018-12-13: 12 mg via INTRAMUSCULAR
  Filled 2018-12-13: qty 2
  Filled 2018-12-13: qty 5

## 2018-12-13 MED ORDER — PRENATAL MULTIVITAMIN CH
1.0000 | ORAL_TABLET | Freq: Every day | ORAL | Status: DC
  Administered 2018-12-14 – 2018-12-15 (×2): 1 via ORAL
  Filled 2018-12-13 (×2): qty 1

## 2018-12-13 MED ORDER — FENTANYL-BUPIVACAINE-NACL 0.5-0.125-0.9 MG/250ML-% EP SOLN: 12.0000 mL/h | EPIDURAL | Status: DC | PRN

## 2018-12-13 MED ORDER — OXYCODONE-ACETAMINOPHEN 5-325 MG PO TABS: 2.0000 | ORAL_TABLET | ORAL | Status: DC | PRN

## 2018-12-13 MED ORDER — LACTATED RINGERS IV SOLN
INTRAVENOUS | Status: DC
  Administered 2018-12-13 (×3): via INTRAVENOUS

## 2018-12-13 MED ORDER — DIPHENHYDRAMINE HCL 50 MG/ML IJ SOLN
25.0000 mg | Freq: Once | INTRAMUSCULAR | Status: AC
  Administered 2018-12-13: 08:00:00 25 mg via INTRAVENOUS

## 2018-12-13 MED ORDER — BUTORPHANOL TARTRATE 1 MG/ML IJ SOLN
1.0000 mg | INTRAMUSCULAR | Status: DC | PRN
  Administered 2018-12-13: 1 mg via INTRAVENOUS
  Filled 2018-12-13: qty 1

## 2018-12-13 MED ORDER — ACETAMINOPHEN 325 MG PO TABS
650.0000 mg | ORAL_TABLET | ORAL | Status: DC | PRN
  Administered 2018-12-14: 650 mg via ORAL
  Filled 2018-12-13 (×2): qty 2

## 2018-12-13 MED ORDER — VANCOMYCIN HCL IN DEXTROSE 1-5 GM/200ML-% IV SOLN
1000.0000 mg | Freq: Two times a day (BID) | INTRAVENOUS | Status: DC
  Filled 2018-12-13: qty 200

## 2018-12-13 MED ORDER — VANCOMYCIN HCL 10 G IV SOLR
2000.0000 mg | Freq: Once | INTRAVENOUS | Status: AC
  Administered 2018-12-13: 2000 mg via INTRAVENOUS
  Filled 2018-12-13: qty 2000

## 2018-12-13 MED ORDER — ONDANSETRON HCL 4 MG/2ML IJ SOLN: 4.0000 mg | Freq: Four times a day (QID) | INTRAMUSCULAR | Status: DC | PRN

## 2018-12-13 MED ORDER — PHENYLEPHRINE 40 MCG/ML (10ML) SYRINGE FOR IV PUSH (FOR BLOOD PRESSURE SUPPORT): 80.0000 ug | PREFILLED_SYRINGE | INTRAVENOUS | Status: DC | PRN

## 2018-12-13 MED ORDER — MISOPROSTOL 50MCG HALF TABLET
50.0000 ug | ORAL_TABLET | ORAL | Status: DC
  Administered 2018-12-13: 50 ug via BUCCAL
  Filled 2018-12-13: qty 1

## 2018-12-13 MED ORDER — ONDANSETRON HCL 4 MG PO TABS: 4.0000 mg | ORAL_TABLET | ORAL | Status: DC | PRN

## 2018-12-13 MED ORDER — TERBUTALINE SULFATE 1 MG/ML IJ SOLN: 0.2500 mg | Freq: Once | INTRAMUSCULAR | Status: DC | PRN

## 2018-12-13 MED ORDER — OXYTOCIN BOLUS FROM INFUSION
500.0000 mL | Freq: Once | INTRAVENOUS | Status: AC
  Administered 2018-12-13: 20:00:00 500 mL via INTRAVENOUS

## 2018-12-13 MED ORDER — SOD CITRATE-CITRIC ACID 500-334 MG/5ML PO SOLN: 30.0000 mL | ORAL | Status: DC | PRN

## 2018-12-13 MED ORDER — BENZOCAINE-MENTHOL 20-0.5 % EX AERO: 1.0000 "application " | INHALATION_SPRAY | CUTANEOUS | Status: DC | PRN
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
## 2018-12-13 NOTE — MAU Provider Note (Signed)
First Provider Initiated Contact with Patient 12/13/18 [phone]       S: Nurse calls provider to bedside for patient who c/o intense onset of itching.  Provider to bedside.  Donna Hebert is a 23 y.o. G2P0010 at [redacted]w[redacted]d  who presents to MAU today for LOF and is to be admitted for pROM.  Patient sitting upright in bed, scratching skin particular to head, neck, and abdomen.  Patient reports head is now bleeding from scratching.  O: BP 116/65   Pulse 97   Temp 98.5 F (36.9 C) (Oral)   Resp 18   Ht 5\' 4"  (1.626 m)   Wt 67.6 kg   BMI 25.58 kg/m  GENERAL: Well-developed, well-nourished female in no acute distress.  HEAD: Normocephalic, atraumatic.  CHEST: Normal effort of breathing, regular heart rate and rhythm.  CTA-Bilaterally  ABDOMEN: Soft, nontender, gravid SKIN: Red, No apparent hives, rash, or other eruptions.   Cervical exam:  Dilation: 1.5 Effacement (%): 40 Cervical Position: Posterior Station: -2 Presentation: Vertex Exam by:: fair, ch   Fetal Monitoring: Baseline: 135 Variability: Moderate Accelerations: Present Decelerations: None Contractions: Q63min   A: SIUP at [redacted]w[redacted]d  Cat I FT Red Man Syndrome  P: Discontinue infusion Give Benadryl 25mg  IV for itching Informed that Dr. Melba Coon will discuss POC which may include restarting infusion at lower slower rate. Patient without questions or concerns. Patient reports improvement of itching prior to provider exit Will transfer to L&D  Gavin Pound, CNM 12/13/2018 8:29 AM

## 2018-12-13 NOTE — MAU Note (Signed)
Leaking fld since 0445. Has jeans on and cannot tell the color. No pain.

## 2018-12-13 NOTE — Progress Notes (Signed)
Patient ID: Donna Hebert, female   DOB: 09/09/1995, 23 y.o.   MRN: [id number] Pt reports painful contractions VSS GEN - moderate pain EFM - 135, early decels  TOCO - ctxs q 55mins SVE - 5.5/80/-1  A/P: Progressing well in labor         Pain control prn pt request         Anticipate svd

## 2018-12-13 NOTE — H&P (Addendum)
Donna Hebert is a 23 y.o.G2P0010  female presenting at 23 3/7wks for PPROM. Pt only 1cm dilated. Pt is dated per LMP, confirmed with a 9 week [place]. She has history of panic attacks- stable.  She had rapid GBS done in MAU - negative. Had LGSIL in pregnancy - follow pp. Neg flu vaccine. Neg carrier screening and first trimester screen OB History    Gravida  2   Para      Term      Preterm      AB  1   Living        SAB  1   TAB      Ectopic      Multiple      Live Births             Past Medical History:  Diagnosis Date  . Pregnant   . Syncope    Past Surgical History:  Procedure Laterality Date  . NO PAST SURGERIES     Family History: family history includes Kidney disease in her mother; Thyroid cancer in her maternal grandmother. Social History:  reports that she has never smoked. She has never used smokeless tobacco. She reports that she does not drink alcohol or use drugs.     Maternal Diabetes: No Genetic Screening: Normal Maternal Ultrasounds/Referrals: Normal Fetal Ultrasounds or other Referrals:  None Maternal Substance Abuse:  No Significant Maternal Medications:  None Significant Maternal Lab Results:  Group B Strep negative Other Comments:  None  Review of Systems  Constitutional: Positive for malaise/fatigue. Negative for chills, fever and weight loss.  Eyes: Negative for blurred vision and double vision.  Respiratory: Negative for shortness of breath.   Cardiovascular: Negative for chest pain.  Gastrointestinal: Positive for abdominal pain. Negative for heartburn, nausea and vomiting.  Genitourinary: Negative for dysuria.  Musculoskeletal: Positive for back pain.  Skin: Negative for itching and rash.  Neurological: Negative for dizziness and headaches.  Endo/Heme/Allergies: Negative for environmental allergies. Does not bruise/bleed easily.  Psychiatric/Behavioral: Negative for depression, hallucinations, substance abuse and suicidal ideas.    Maternal Medical History:  Reason for admission: Rupture of membranes.  Nausea.  Contractions: Onset was 1-2 hours ago.   Frequency: regular.   Perceived severity is moderate.    Fetal activity: Perceived fetal activity is normal.   Last perceived fetal movement was within the past hour.    Prenatal complications: no prenatal complications Prenatal Complications - Diabetes: Diabetes is managed by insulin pump.      Dilation: 1.5 Effacement (%): 40 Station: -2 Exam by:: fair, ch Blood pressure 116/65, pulse 97, temperature 98.9 F (37.2 C), temperature source Oral, resp. rate 18, height 5\' 4"  (1.626 m), weight 67.6 kg, unknown if currently breastfeeding. Maternal Exam:  Uterine Assessment: Contraction strength is moderate.  Contraction frequency is regular.   Abdomen: Patient reports generalized tenderness.  Estimated fetal weight is AGA.   Fetal presentation: vertex  Introitus: Normal vulva. Vulva is negative for condylomata and lesion.  Normal vagina.  Vagina is negative for condylomata.  Amniotic fluid character: clear.  Pelvis: adequate for delivery.   Cervix: Cervix evaluated by digital exam.     Fetal Exam Fetal Monitor Review: Baseline rate: 130.  Variability: moderate (6-25 bpm).   Pattern: accelerations present and no decelerations.    Fetal State Assessment: Category I - tracings are normal.     Physical Exam  GI: There is generalized abdominal tenderness.  Genitourinary:    Vulva normal.  No vulval condylomata or lesion noted.     Prenatal labs: ABO, Rh: --/--/O POS (10/29 0654) Antibody: NEG (10/29 0654) Rubella:   RPR: NON REACTIVE (10/29 0654)  HBsAg:    HIV:    GBS: --/NEGATIVE (10/29 0735)   Assessment/Plan: G2P0010 at 39 3/7wks with PPROM Admit; no vaginal lesions GBS negative Cytotec orally given - recheck prn Pain control prn Anticipate svd    Cecilia W Banga 12/13/2018, 1:56 PM

## 2018-12-14 LAB — CBC
HCT: 36.6 % (ref 36.0–46.0)
Hemoglobin: 11.9 g/dL — ABNORMAL LOW (ref 12.0–15.0)
MCH: 29.1 pg (ref 26.0–34.0)
MCHC: 32.5 g/dL (ref 30.0–36.0)
MCV: 89.5 fL (ref 80.0–100.0)
Platelets: 173 10*3/uL (ref 150–400)
RBC: 4.09 MIL/uL (ref 3.87–5.11)
RDW: 13.4 % (ref 11.5–15.5)
WBC: 22.7 10*3/uL — ABNORMAL HIGH (ref 4.0–10.5)
nRBC: 0 % (ref 0.0–0.2)

## 2018-12-14 NOTE — Progress Notes (Signed)
MOB was referred for history of panic attacks.  * Referral screened out by Clinical Social Worker because none of the following criteria appear to apply:  ~ History of panic attacks during this pregnancy, or of post-partum depression following prior delivery. ~ Diagnosis of panic attacks within last 3 years. Per chart review, MOB has history of panic attacks starting in middle school and has not had one since early high school.  OR * MOB's symptoms currently being treated with medication and/or therapy.  Please contact the Clinical Social Worker if needs arise, by MOB request, or if MOB scores greater than 9/yes to question 10 on Edinburgh Postpartum Depression Screen.  Rionna Feltes, LCSWA  [facility] [phone]  

## 2018-12-14 NOTE — Lactation Note (Addendum)
This note was copied from a baby's chart. Lactation Consultation Note  Patient Name: Donna Hebert [id number] Date: 12/14/2018 Reason for consult: Follow-up assessment   Baby [redacted]w[redacted]d, 45 hours old and assistance was requested for latching. Mother's nipples are evert and compressible. Demonstrated how to wake baby for feeding.  Mother hand expressed good flow of colostrum. After a few attempts baby was able to latch in football hold.  FOB assisted.  Cross cradle hold seems to cause more pain. Mother has off and on feelings of pinching.  Baby has short anterior frenulum. Mother states she had her frenotomy as did her father and sister. Provided information sheet and suggest she discuss with Ped MD and make OP lactation appt at her [place] [facility]. Demonstrated how to use #24 NS in case it is needed for pain later today.  Mother today ordered Spectra pump which will not be delivered for 3-5 days. Mother may cancel pump and get employee pump here at hospital.  Mother will call if she decides.  Mother stated during consult that she does not want to give her baby formula.   Returned to room and demonstrated how to finger syringe feed baby.  Mother also knows how to cup and spoon feed.  Plan: 1. Keep baby STS as much as possible  2. Offer breast when baby cues that he/she is hungry, or awaken baby for feeding at 3 hrs. 3.  Breast feed baby, asking for help prn.  Limit to 30 mins so not to overtire baby. 4. If baby does not latch after 10 min of attempt - give supplemental breastmilk.  5.  Pump both breasts 15-20 minutes and give volume back to baby at next feeding.      Maternal Data    Feeding Feeding Type: Breast Fed  LATCH Score Latch: Repeated attempts needed to sustain latch, nipple held in mouth throughout feeding, stimulation needed to elicit sucking reflex.  Audible Swallowing: A few with stimulation  Type of Nipple: Everted at rest and after  stimulation  Comfort (Breast/Nipple): Soft / non-tender  Hold (Positioning): Assistance needed to correctly position infant at breast and maintain latch.  LATCH Score: 7  Interventions Interventions: Breast feeding basics reviewed;Assisted with latch;Skin to skin;Hand express;Breast compression;Adjust position;Support pillows;Position options;DEBP  Lactation Tools Discussed/Used Tools: Pump Breast pump type: Double-Electric Breast Pump   Consult Status Consult Status: Follow-up Date: 12/15/18 Follow-up type: In-patient    Donna Hebert [facility] 12/14/2018, 12:25 PM

## 2018-12-14 NOTE — Progress Notes (Signed)
Post Partum Day 1 Subjective: no complaints, up ad lib and tolerating PO  Objective: Blood pressure (!) 97/56, pulse 74, temperature 98.4 F (36.9 C), temperature source Oral, resp. rate 17, height 5\' 4"  (1.626 m), weight 67.6 kg, SpO2 100 %, unknown if currently breastfeeding.  Physical Exam:  General: alert and cooperative Lochia: appropriate Uterine Fundus: firm   Recent Labs    12/13/18 0654 12/14/18 0545  HGB 13.0 11.9*  HCT 41.2 36.6    Assessment/Plan: Plan for discharge tomorrow  D/w pt circumcision in detail and wants to proceed   LOS: 1 day   Donna Hebert 12/14/2018, 10:25 AM

## 2018-12-14 NOTE — Progress Notes (Signed)
Educated MOB & FOB about how to care for Late Preterm Infant (LPI). Encouraged skin to skin, to keep hat on at all times, and to decrease stimulation. Educated parents that infant needs to eat 8-12x's in a 24 period and that feedings should not exceed 30 minutes. Educated parents that they should not rely on feeding cues and should be waking up infant q3h to feed. Encouraged parents to call RN if infant would not wake up, had trouble latching, or would fall asleep at the breast. MOB verbalized that she would like to exclusively breastfeed, RN educated mom about the importance of supplementation with a LPI, mom verbalized understanding. RN set MOB up with DEBP and taught hand expression, cleaning and storage of DEBP. MOB able to express milk w/DEBP and RN finger fed infant. RN gave parents handout concerning LPI. Parents verbalized understanding and had no further questions. RN will continue to reinforce education and continue to monitor.

## 2018-12-14 NOTE — Lactation Note (Addendum)
This note was copied from a baby's chart. Lactation Consultation Note  Patient Name: Donna Hebert [id number] Date: 12/14/2018 Reason for consult: Initial assessment;1st time breastfeeding;Late-preterm 34-36.6wks P1, 6 hour LPTI. Per mom, she feels breastfeeding is going well. Per Nurse, infant is latching well for LPTI. 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 Mom has ordered Spectra 2 DEBP. Infant had two voids since birth. Tools: Nurse set up and explained to mom how to use DEBP. LC discussed with mom she can spoon feed EBM  or use foley cup when giving infant back volume after breastfeeding him.  Mom shown how to use DEBP & how to disassemble, clean, & reassemble parts. Mom has ordered Spectra 2 that is coming in the mail from her [employer]. Nurse assisted  Mom with latch and infant to the breast, infant breastfed twice for 30 minutes each and mom made two attempts prior. Mom used DEBP and pumped 3 ml of colostrum that was given to infant prior to latching infant to breast at 0135 am , mom gave infant an  additional 4 ml of colostrum by spoon instead of finger feeding infant with her finger which mom preferred.  LC did not observe a latch at this time, Nurse assisted mom in breastfeeding infant at 0135 less than one and half hours when [facility] entered the room.  LC discussed and reviewed LPTI policy:  mom taught back to do STS, breastfed infant 8 to 12 times within 24 hours and not exceed 3 hours not making infant wait to breastfeed, not breastfeed infant longer than 30 minutes per feedings. Mom has used the  DEBP once and plans to pump every 3 hours for 15 minutes both breast on initial setting. Mom's plans: 1. Mom will breastfeed according LPTI policy( green sheet given) 2. Mom will give infant back any EBM according infant's age/ hours of life. 3. Mom will pump every 3 hours both breast for 15 minutes on initial setting. 4. Parents will do as much STS as possible with infant.  Mom  knows to call Nurse or [place] if she has any questions, concerns or needs assistance with latching infant to breast.  Mom knows how to hand express Mom made aware of O/P services, breastfeeding support groups, community resources, and our phone # for post-discharge questions.   Maternal Data Formula Feeding for Exclusion: No Has patient been taught Hand Expression?: Yes(Mom hand expressed and used pump 6 ml of colostrum that was given to infant on spoon.) Does the patient have breastfeeding experience prior to this delivery?: No  Feeding Feeding Type: Breast Fed  LATCH Score Latch: Grasps breast easily, tongue down, lips flanged, rhythmical sucking.  Audible Swallowing: Spontaneous and intermittent  Type of Nipple: Everted at rest and after stimulation  Comfort (Breast/Nipple): Soft / non-tender  Hold (Positioning): Assistance needed to correctly position infant at breast and maintain latch.  LATCH Score: 9  Interventions Interventions: Breast feeding basics reviewed;Skin to skin;DEBP;Hand express;Expressed milk  Lactation Tools Discussed/Used Tools: Other (comment)(gloved finger) WIC Program: No Pump Review: Setup, frequency, and cleaning;Milk Storage Initiated by:: by Night Nurse Date initiated:: 12/14/18   Consult Status Consult Status: Follow-up Date: 12/14/18 Follow-up type: In-patient    Vicente Serene 12/14/2018, 2:54 AM

## 2018-12-15 MED ORDER — IBUPROFEN 600 MG PO TABS: 600.0000 mg | ORAL_TABLET | Freq: Four times a day (QID) | ORAL | 0 refills | Status: AC

## 2018-12-15 MED ORDER — ACETAMINOPHEN 325 MG PO TABS: 650.0000 mg | ORAL_TABLET | ORAL | 0 refills | Status: AC | PRN

## 2018-12-15 NOTE — Progress Notes (Signed)
Post Partum Day 2 Subjective: no complaints, up ad lib and tolerating PO  Working hard on breastfeeding with baby preterm.  Objective: Blood pressure 117/64, pulse 70, temperature 98.3 F (36.8 C), temperature source Oral, resp. rate 16, height 5\' 4"  (1.626 m), weight 67.6 kg, SpO2 100 %, unknown if currently breastfeeding.  Physical Exam:  General: alert and cooperative Lochia: appropriate Uterine Fundus: firm   Recent Labs    12/13/18 0654 12/14/18 0545  HGB 13.0 11.9*  HCT 41.2 36.6    Assessment/Plan: Will d/c but pt to room in this PM with baby to continue to work on feeding with weight loss. She has decided to wait and circumcise baby in the office later this week given the preterm status and weight loss   LOS: 2 days   Logan Bores 12/15/2018, 9:25 AM

## 2018-12-15 NOTE — Lactation Note (Signed)
This note was copied from a baby's chart. Lactation Consultation Note  Patient Name: Donna Hebert [id number] Date: 12/15/2018 Reason for consult: Follow-up assessment;Infant < 6lbs;Late-preterm 34-36.6wks;Infant weight loss;1st time breastfeeding;Primapara  P1 Mom of LPTI at 16 hrs old.  Baby at 6.6% weight loss, with great output and transitioning stools.  Mom has been exclusively breastfeeding baby, and double pumping using the [employer] DEBP to support her milk supply, after every feeding.   Mom has also been hand expressing colostrum and spoon feeding baby 2-4 ml.    Breasts are filling and feeling heavier today.  Mom feeling some nipple pinching on and off, but can reposition baby for a deeper latch to breast.  No visible nipple trauma noted.  Reviewed importance of disassembling pump parts, washing, rinsing and air drying in separate bin (set this up for Mom) away from sink.    Plan- 1- Keep baby STS as much as possible 2- Offer breast often with any feeding cues (goal of >8 feedings per 24 hrs) 3-Awaken baby prn as necessary  4- Pump both breasts 15 mins on initiation setting, using breast massage and hand expression 5- feed baby EBM by spoon after breastfeeding.   Baby has just fed for 30 mins, FOB is burping baby.  Mom plans to pump, and offer EBM by spoon.  Mom aware of DEBP loaner pump available to her for $30 deposit.  Mom is interested in this as her employee pump won't arrive for a few days.  Mom also will desire OP lactation follow up.   Mom to call when baby starts feeding next time for latch assist/assess by RN or [place].  Interventions Interventions: Breast feeding basics reviewed;Skin to skin;Breast massage;Hand express;DEBP;Hand pump  Lactation Tools Discussed/Used Tools: Pump Breast pump type: Double-Electric Breast Pump   Consult Status Consult Status: Follow-up Date: 12/16/18 Follow-up type: In-patient    Broadus John 12/15/2018, 9:09 AM

## 2018-12-15 NOTE — Progress Notes (Signed)
Mom concerned about baby's weight loss, RN reeducated MOB about LPI guidelines and supplementation. MOB verbalized that she does not want to supplement and would like to speak to [facility] and pediatrician. RN encouraged MOB to continue breastfeeding and pumping. MOB verbalized understanding.

## 2018-12-15 NOTE — Discharge Summary (Signed)
OB Discharge Summary     Patient Name: Donna Hebert DOB: 1996/01/10 MRN: [id number]  Date of admission: 12/13/2018 Delivering MD: Pryor Ochoa [facility]   Date of discharge: 12/15/2018  Admitting diagnosis: 35WKS LEAKING FLUID Intrauterine pregnancy: [redacted]w[redacted]d     Secondary diagnosis:  Active Problems:   PROM (premature rupture of membranes)   SVD (spontaneous vaginal delivery)  Additional problems: none     Discharge diagnosis: Preterm Pregnancy Delivered                                                                                                Post partum procedures:None  Augmentation: Cytotec  Complications: None  Hospital course:  Onset of Labor With Vaginal Delivery     23 y.o. yo G2P0111 at [redacted]w[redacted]d was admitted in Latent Labor on 12/13/2018. Patient had an uncomplicated labor course as follows:  Membrane Rupture Time/Date: 5:09 AM ,12/13/2018   Intrapartum Procedures: Episiotomy: None [1]                                         Lacerations:  None [1]  Patient had a delivery of a Viable infant. 12/13/2018  Information for the patient's newborn:  Aretha, Levi [[id number]]  Delivery Method: Vag-Spont     Pateint had an uncomplicated postpartum course.  She is ambulating, tolerating a regular diet, passing flatus, and urinating well. Patient is discharged home in stable condition on 12/15/18.   Physical exam  Vitals:   12/14/18 0746 12/14/18 1133 12/14/18 2218 12/15/18 0620  BP: (!) 97/56 (!) 106/58 (!) 121/57 117/64  Pulse: 74 72 79 70  Resp: 17 18 18 16   Temp: 98.4 F (36.9 C) 98.3 F (36.8 C) 98.4 F (36.9 C) 98.3 F (36.8 C)  TempSrc: Oral Oral Oral Oral  SpO2:      Weight:      Height:       General: alert and cooperative Lochia: appropriate Uterine Fundus: firm  Labs: Lab Results  Component Value Date   WBC 22.7 (H) 12/14/2018   HGB 11.9 (L) 12/14/2018   HCT 36.6 12/14/2018   MCV 89.5 12/14/2018   PLT 173 12/14/2018   CMP Latest  Ref Rng & Units 07/06/2011  Glucose 70 - 99 mg/dL 87  BUN 6 - 23 mg/dL 12  Creatinine 07/08/2011 - 3.47 mg/dL 4.25  Sodium 9.56 - 387 mEq/L 137  Potassium 3.5 - 5.1 mEq/L 3.8  Chloride 96 - 112 mEq/L 105  CO2 19 - 32 mEq/L 20  Calcium 8.4 - 10.5 mg/dL 8.9  Total Protein 6.0 - 8.3 g/dL 6.9  Total Bilirubin 0.3 - 1.2 mg/dL 564)  Alkaline Phos 47 - 119 U/L 71  AST 0 - 37 U/L 19  ALT 0 - 35 U/L 10    Discharge instruction: per After Visit Summary and "Baby and Me Booklet".  After visit meds:  Allergies as of 12/15/2018      Reactions   Penicillins Hives, Swelling   Did it involve  swelling of the face/tongue/throat, SOB, or low BP? Unknown Did it involve sudden or severe rash/hives, skin peeling, or any reaction on the inside of your mouth or nose? Unknown Did you need to seek medical attention at a hospital or doctor's office? Unknown When did it last happen?23yo If all above answers are "NO", may proceed with cephalosporin use.      Medication List    TAKE these medications   acetaminophen 325 MG tablet Commonly known as: Tylenol Take 2 tablets (650 mg total) by mouth every 4 (four) hours as needed (for pain scale < 4).   ibuprofen 600 MG tablet Commonly known as: ADVIL Take 1 tablet (600 mg total) by mouth every 6 (six) hours.   multivitamin-prenatal 27-0.8 MG Tabs tablet Take 1 tablet by mouth daily at 12 noon.       Diet: routine diet  Activity: Advance as tolerated. Pelvic rest for 6 weeks.   Outpatient follow up:6 weeks Follow up Appt:No future appointments. Follow up Visit:No follow-ups on file.  Postpartum contraception: Undecided  Newborn Data: Live born female  Birth Weight: 5 lb 8.5 oz (2509 g) APGAR: 8, 9  Newborn Delivery   Birth date/time: 12/13/2018 20:24:00 Delivery type: Vaginal, Spontaneous      Baby Feeding: Breast Disposition:rooming in to work on feedings and watch weight   12/15/2018 Logan Bores, MD

## 2018-12-16 ENCOUNTER — Ambulatory Visit: Payer: Self-pay

## 2018-12-16 NOTE — Lactation Note (Signed)
This note was copied from a baby's chart. Lactation Consultation Note:  69. 3 week is now 26 hours old. Infant is 6.1% weight loss which is gain from yesterday.  Mother reports that she just finished a 25 mins feeding. Infant relaxed and content in mothers arms.  Discussed ok to rouse infant and offer the alternate breast.  Mother had questions about cluster feeding. Discussed soothing techniques with parents.   Suggested to continue to offer supplement to infant according to supplemental guidelines.   Discussed treatment and prevention of engorgement.   Mother plans to take home a [facility] Loaner pump for several days until her Spectra pump arrives.  Mother was given a harmony hand pump with instructions to use as needed.  Discussed reverse pressure , hand expression and pre-pumping when breast become full.   Advised to continue to cue base feed and feed infant at least 8-12 or more times in 24 hours. Encouraged frequent STS .  Mother wants to follow up with [facility] for feeding assessment.  Mother is aware of available [place] services , OP support , breastfeeding support group and 24/7 phone service for breastfeeding questions and concerns.  Mother to page when she is ready for discharge and will rent pump.     Patient Name: Donna Hebert [id number] Date: 12/16/2018 Reason for consult: Follow-up assessment;Late-preterm 34-36.6wks;Infant < 6lbs   Maternal Data    Feeding Feeding Type: (just finished feeding. )  LATCH Score                   Interventions Interventions: Hand pump  Lactation Tools Discussed/Used Tools: Feeding cup(fed by dad)   Consult Status      Darla Lesches 12/16/2018, 10:38 AM

## 2018-12-16 NOTE — Lactation Note (Signed)
This note was copied from a baby's chart. Lactation Consultation Note:  Infant cuing and mother sat on the side of bed and latched infant on the left breast in cradle hold. Observed slight dimples in checks. Infants latch somewhat pursed. Encouraged mother to sit with support to her back and pillows under infant.  Mother rented a [employer] loaner until her Spectra pump arrives.  Support and praise given to mother.   Patient Name: Donna Hebert [id number] Date: 12/16/2018 Reason for consult: Follow-up assessment   Maternal Data    Feeding Feeding Type: Breast Fed  LATCH Score Latch: Grasps breast easily, tongue down, lips flanged, rhythmical sucking.  Audible Swallowing: Spontaneous and intermittent  Type of Nipple: Everted at rest and after stimulation  Comfort (Breast/Nipple): Filling, red/small blisters or bruises, mild/mod discomfort  Hold (Positioning): No assistance needed to correctly position infant at breast.  LATCH Score: 9  Interventions Interventions: Hand pump  Lactation Tools Discussed/Used Tools: Feeding cup(fed by dad)   Consult Status Consult Status: Complete    Darla Lesches 12/16/2018, 11:19 AM

## 2018-12-19 ENCOUNTER — Encounter (HOSPITAL_COMMUNITY): Payer: Self-pay | Admitting: *Deleted

## 2019-01-23 DIAGNOSIS — Z8742 Personal history of other diseases of the female genital tract: Secondary | ICD-10-CM | POA: Diagnosis not present

## 2019-01-23 DIAGNOSIS — Z3009 Encounter for other general counseling and advice on contraception: Secondary | ICD-10-CM | POA: Diagnosis not present

## 2019-01-23 DIAGNOSIS — Z1389 Encounter for screening for other disorder: Secondary | ICD-10-CM | POA: Diagnosis not present

## 2019-03-25 ENCOUNTER — Other Ambulatory Visit: Payer: Self-pay | Admitting: Cardiology

## 2019-03-25 DIAGNOSIS — Z20822 Contact with and (suspected) exposure to covid-19: Secondary | ICD-10-CM | POA: Diagnosis not present

## 2019-03-26 LAB — NOVEL CORONAVIRUS, NAA: SARS-CoV-2, NAA: NOT DETECTED

## 2019-08-13 ENCOUNTER — Telehealth (HOSPITAL_COMMUNITY): Payer: Self-pay | Admitting: Lactation Services

## 2019-08-13 NOTE — Telephone Encounter (Signed)
Mom calling with concern about possible low milk supply.  Baby is 2 months old and breastfeeding was going well until she returned to work.  She pumped every 4 hours at work and obtained 7-9 ounces per pumping.  She has recently gone to part time.  She states baby is pulling off the breast after a few minutes and acts hungry every 1-2 hours.  Mom considering formula.  Baby taking some solids but doesn't have much interest in them.  Mom is breastfeeding 8 times per day and pumping twice.  She obtains 7 ounces.  Baby weighed 17.4 pounds at his 6 month check up.  Wet diapers are adequate and stools every few days. Recommended she call pediatrician for another weight check.  If weight gain is adequate there is no need to supplement.  Also discussed an outpatient appointment for feeding assessment and pre/post weight.  Mom agreeable to suggestions and will call back if further concerns.

## 2020-01-19 IMAGING — US US OB < 14 WEEKS - US OB TV
1 series · 15 of 28 positions shown · non-contrast
Comparison: None

CLINICAL DATA: Vaginal bleeding in first trimester of pregnancy

EXAM:
OBSTETRIC <14 WK US AND TRANSVAGINAL OB US
TECHNIQUE: Both transabdominal and transvaginal ultrasound examinations were
performed for complete evaluation of the gestation as well as the
maternal uterus, adnexal regions, and pelvic cul-de-sac.
Transvaginal technique was performed to assess early pregnancy.

[Series 1: us ob < 14 weeks - us ob tv · 15 of 65 slices shown]
[im 1/65]
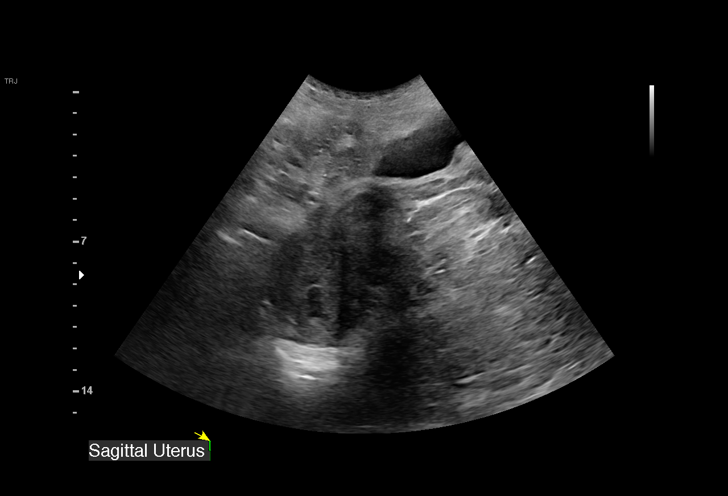
[im 5/65]
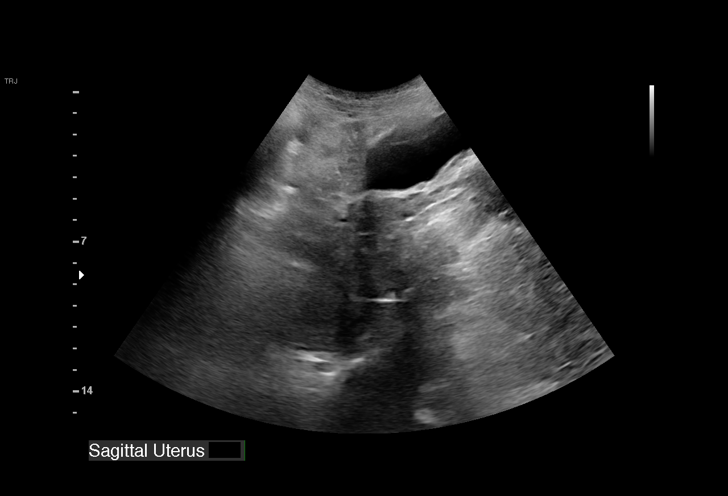
[im 10/65]
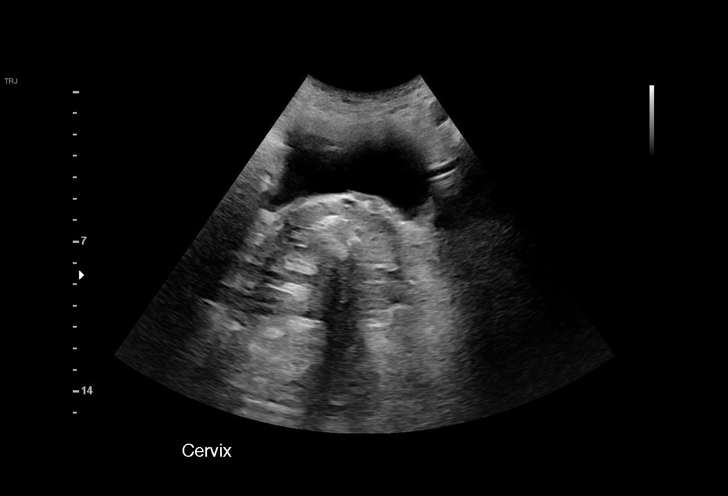
[im 15/65]
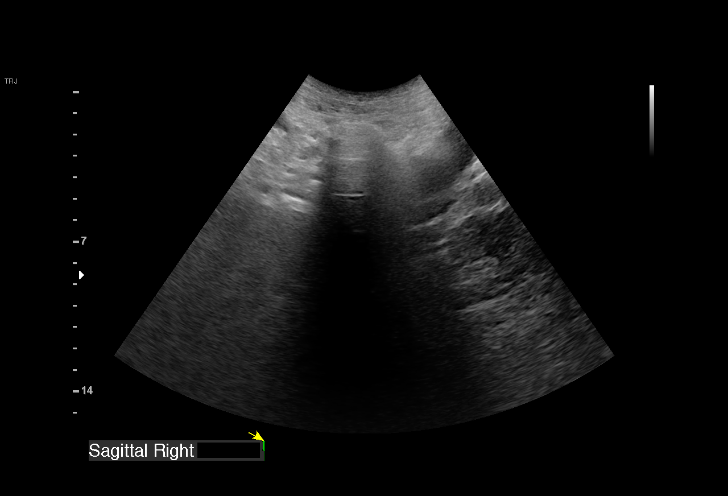
[im 19/65]
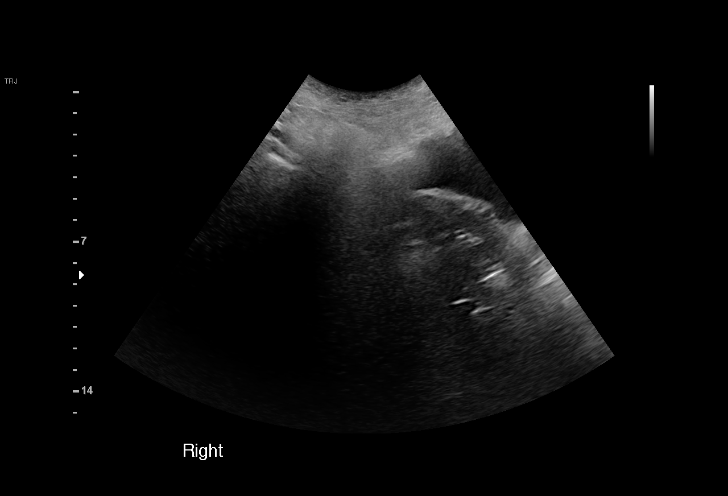
[im 24/65]
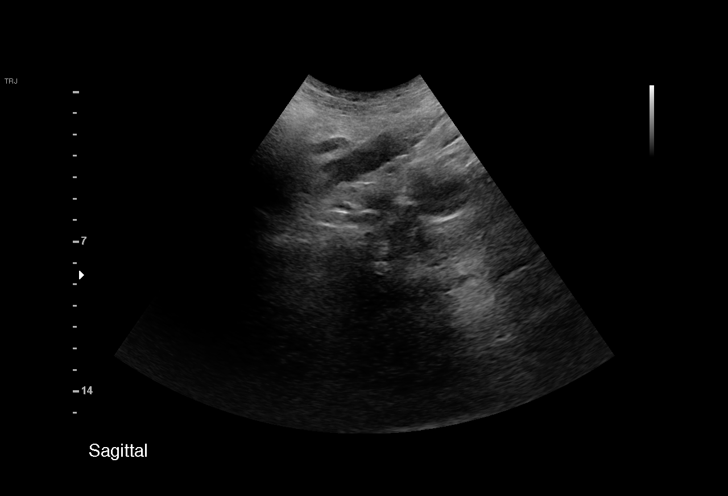
[im 29/65]
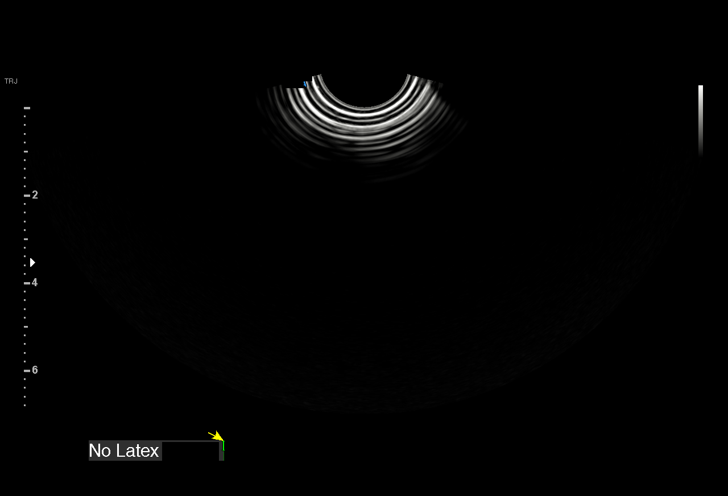
[im 34/65]
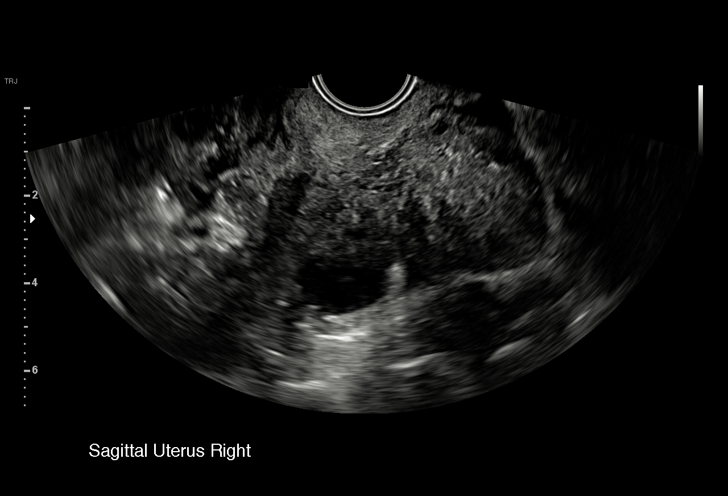
[im 36/65]
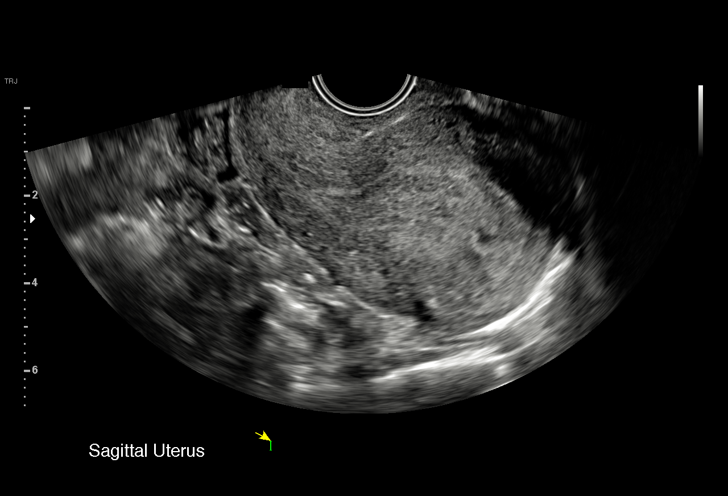
[im 41/65]
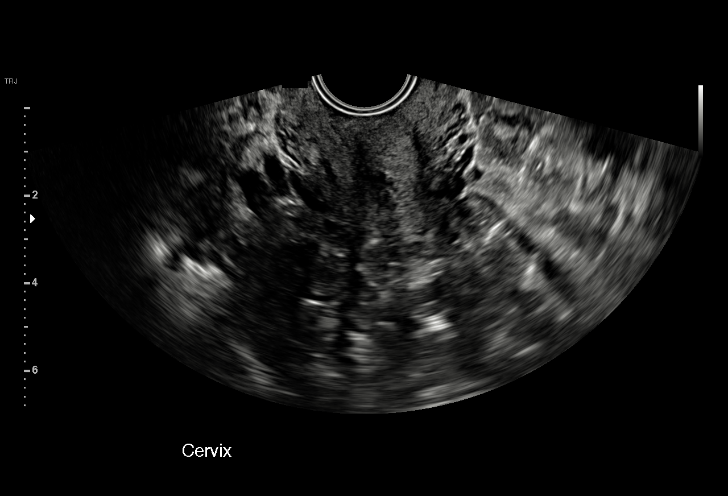
[im 46/65]
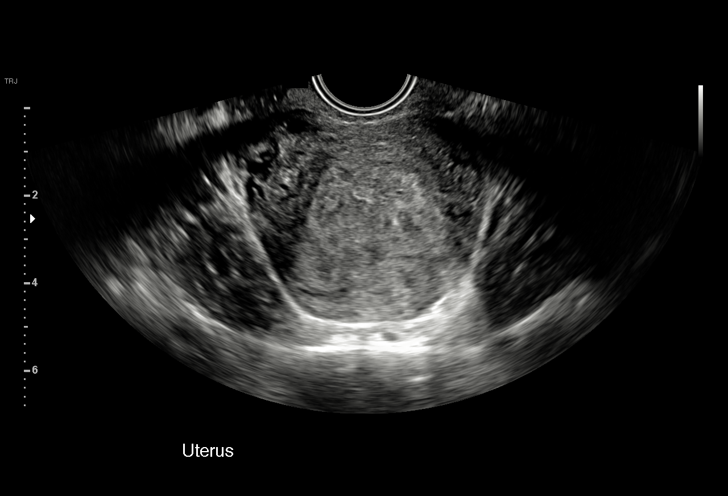
[im 50/65]
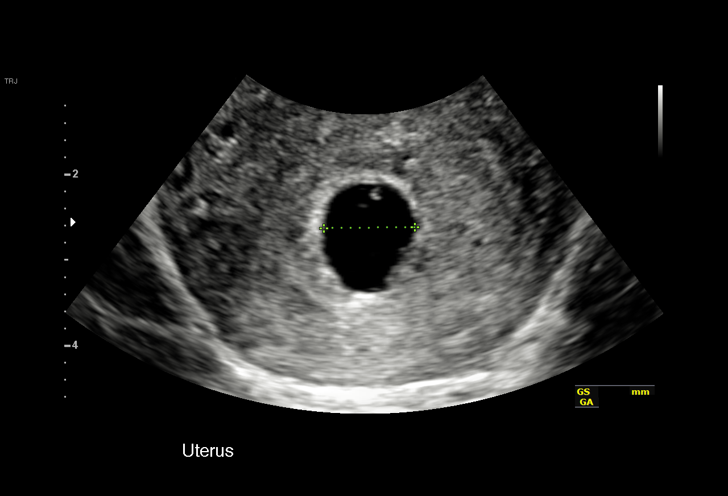
[im 55/65]
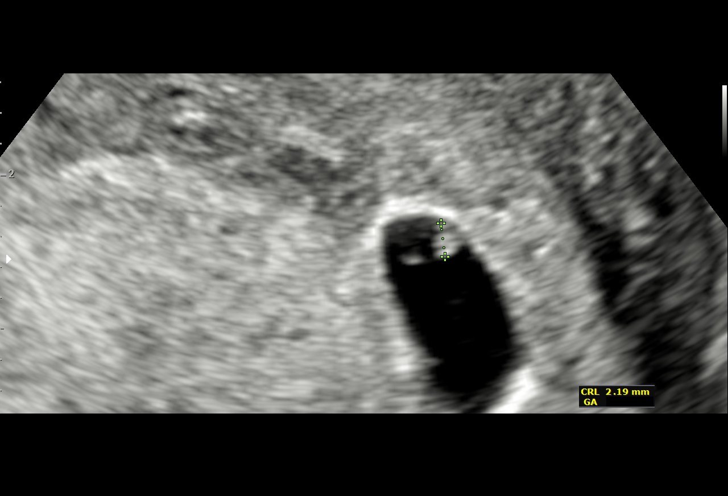
[im 60/65]
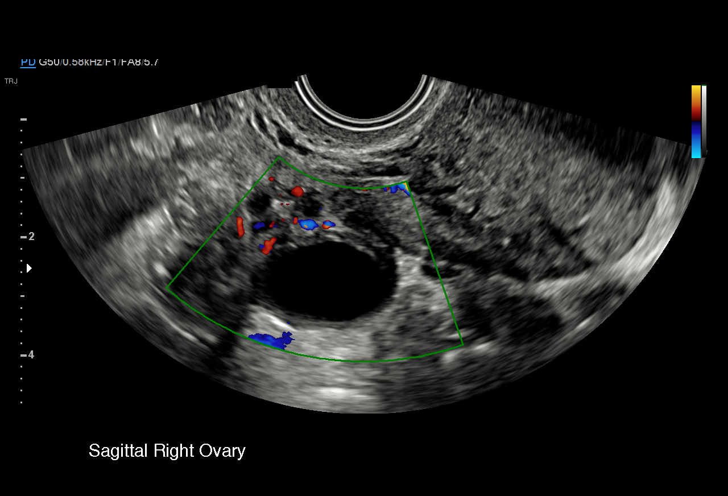
[im 65/65]
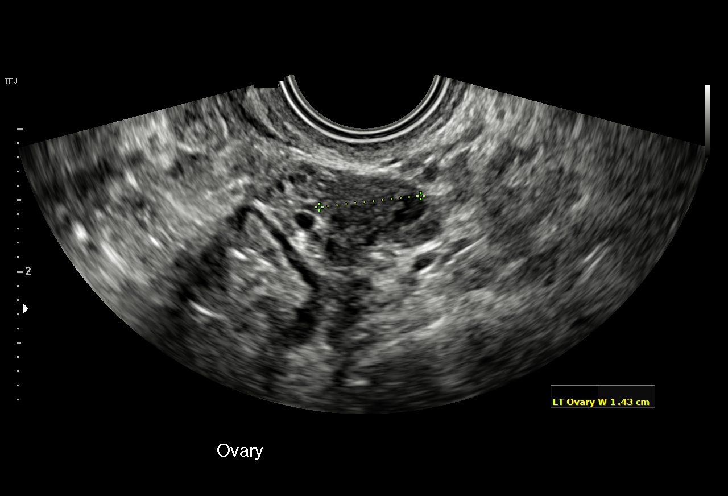

[15 of 28 positions shown; findings below may reference images not displayed]

FINDINGS: Intrauterine gestational sac: Present, single

Yolk sac:  Present

Embryo:  Probably present

Cardiac Activity: Not visualized

Heart Rate: N/A  bpm

MSD: 10.3 mm   5 w   5 d

CRL:  2.3 mm   5 w   5 d                  US EDC: 11/04/2018

Subchorionic hemorrhage:  None identified

Maternal uterus/adnexae:

Retroverted uterus.

RIGHT ovary normal size and morphology 2.3 x 2.2 x 2.0 cm.

LEFT ovary normal size and morphology 2.0 x 1.1 x 1.4 cm.

Trace free pelvic fluid.

No adnexal masses.
IMPRESSION: Gestational sac identified within the uterus containing a yolk sac
and a probable fetal pole.

No fetal cardiac activity is detected to establish viability.

Consider follow-up ultrasound in 10-14 days to establish viability
if clinically indicated.

## 2020-01-20 IMAGING — US US OB TRANSVAGINAL
1 series · 15 of 28 positions shown · non-contrast
Comparison: 03/09/2018

CLINICAL DATA: Vaginal bleeding.  Declining beta HCG level.

EXAM:
TRANSVAGINAL OB ULTRASOUND
TECHNIQUE: Transvaginal ultrasound was performed for complete evaluation of the
gestation as well as the maternal uterus, adnexal regions, and
pelvic cul-de-sac.

[Series 1: us ob transvaginal · 15 of 38 slices shown]
[im 1/38]
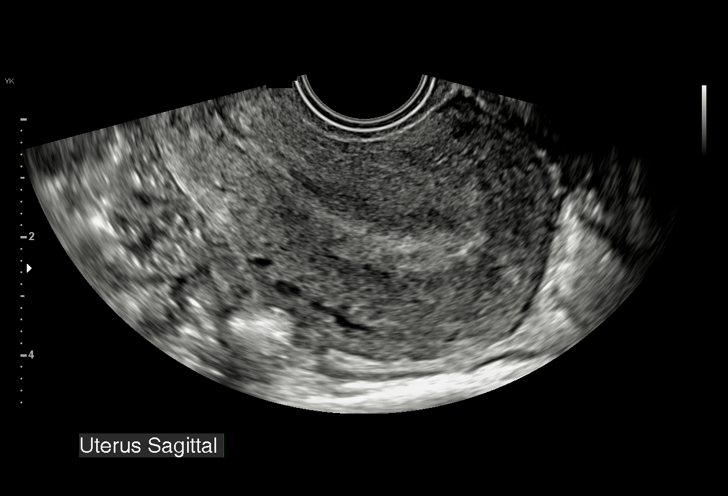
[im 3/38]
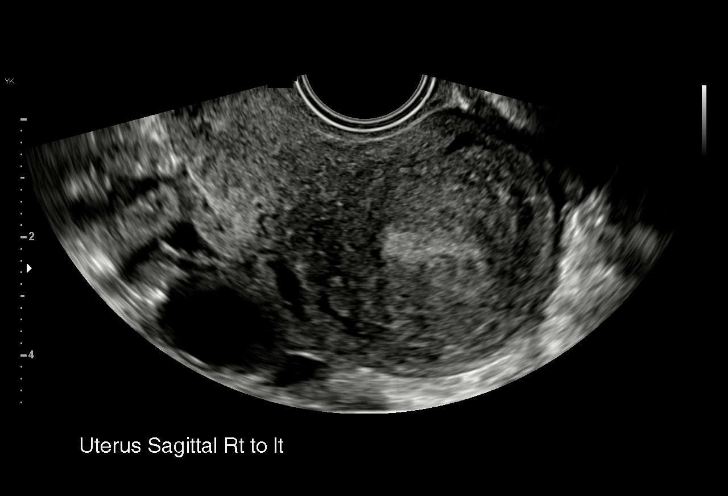
[im 6/38]
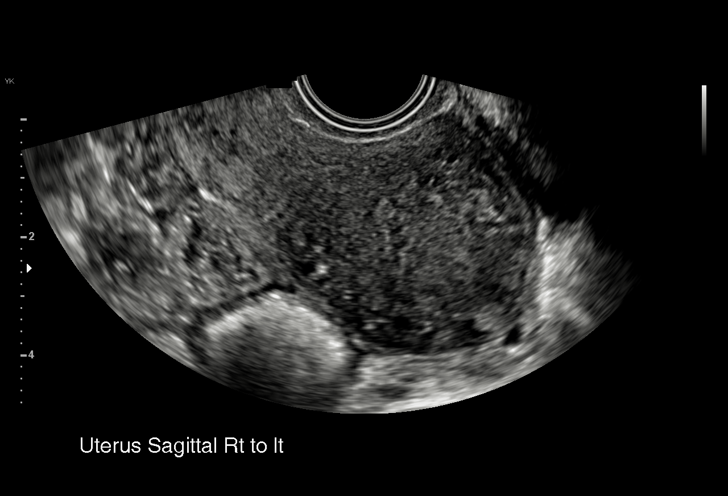
[im 9/38]
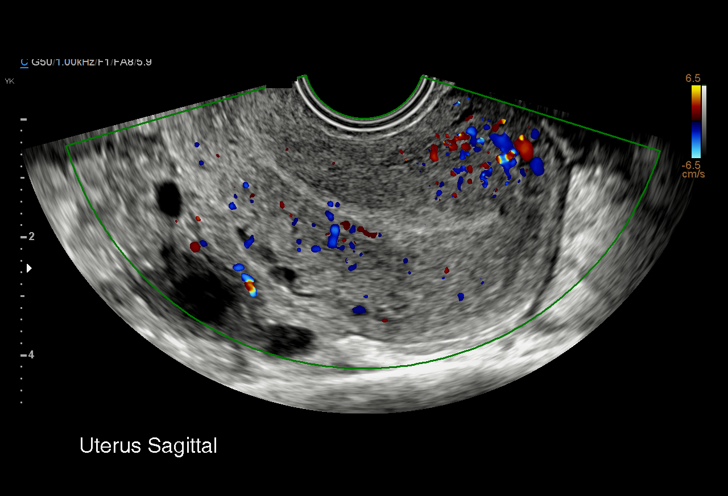
[im 11/38]
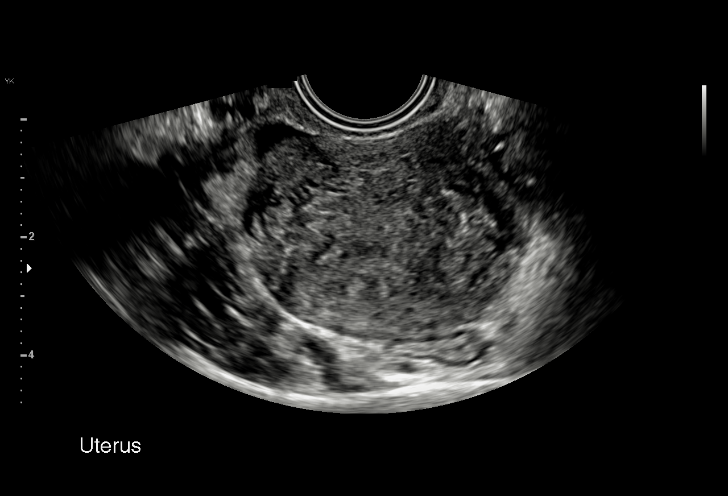
[im 14/38]
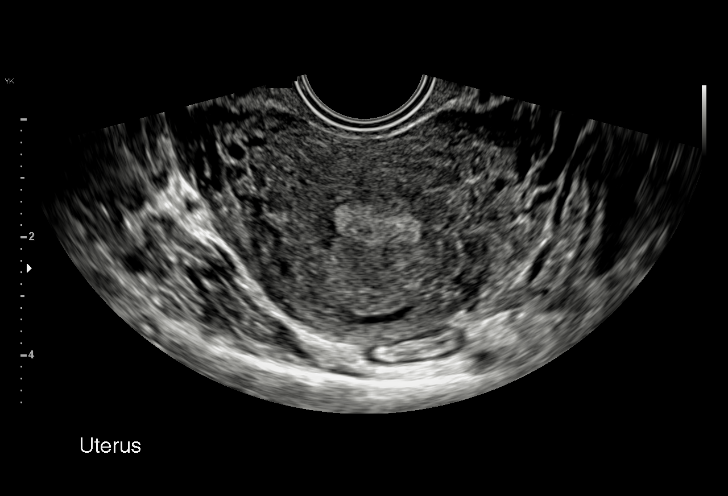
[im 17/38]
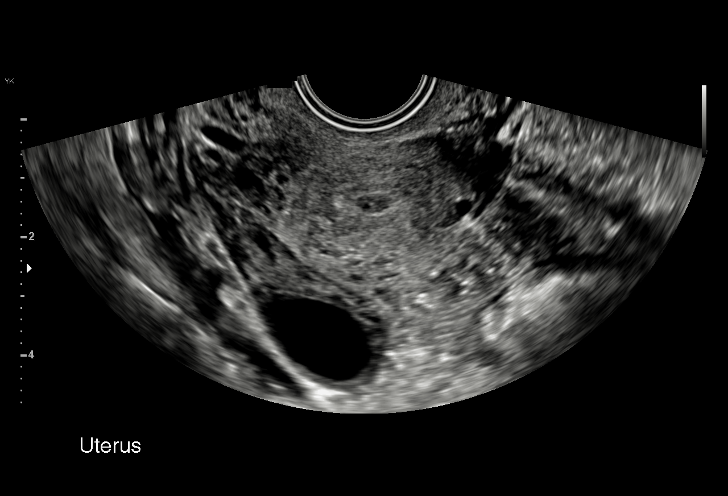
[im 20/38]
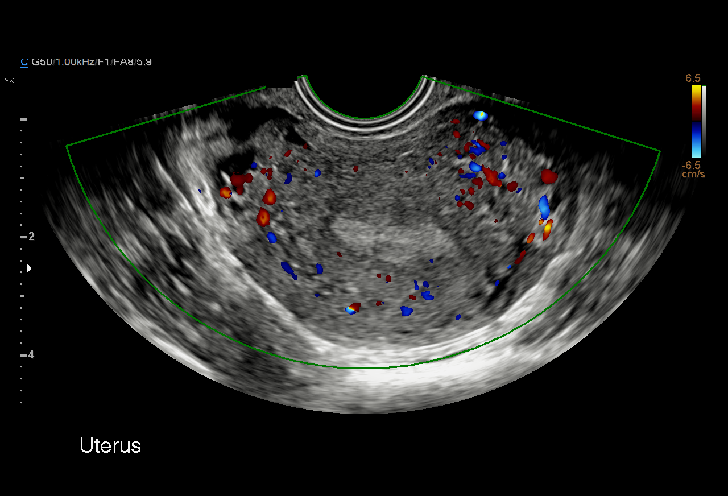
[im 21/38]
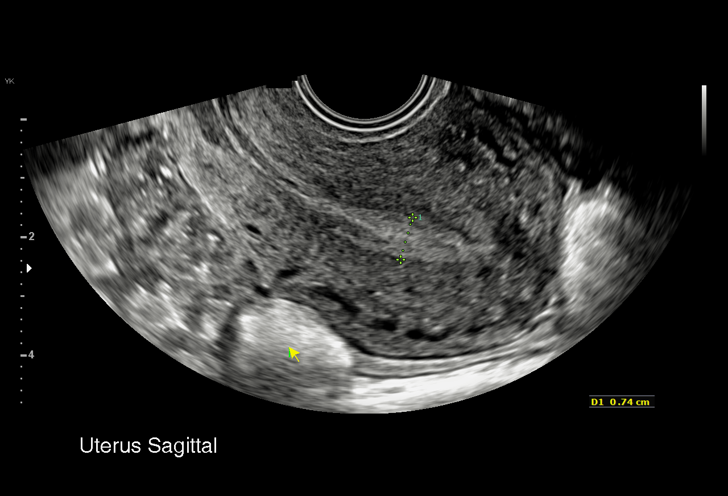
[im 24/38]
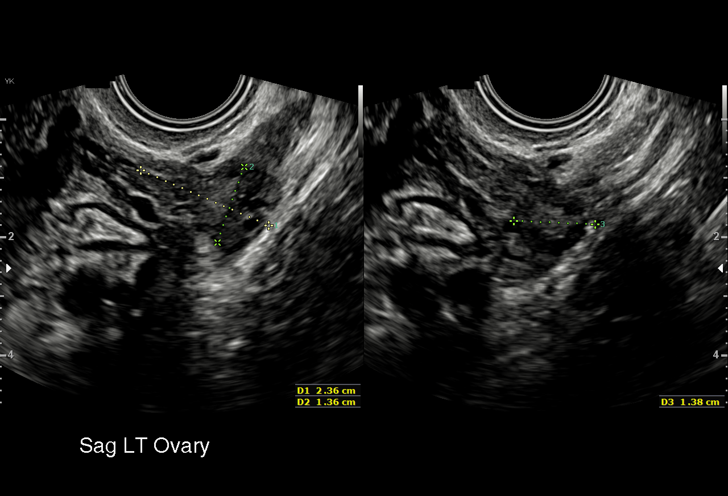
[im 27/38]
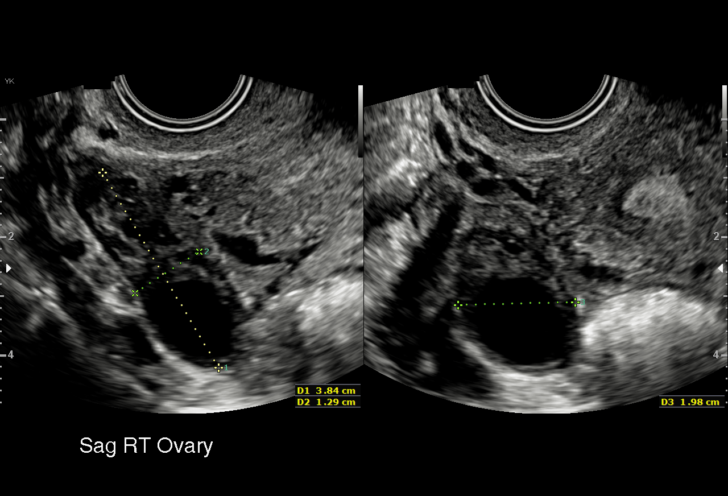
[im 29/38]
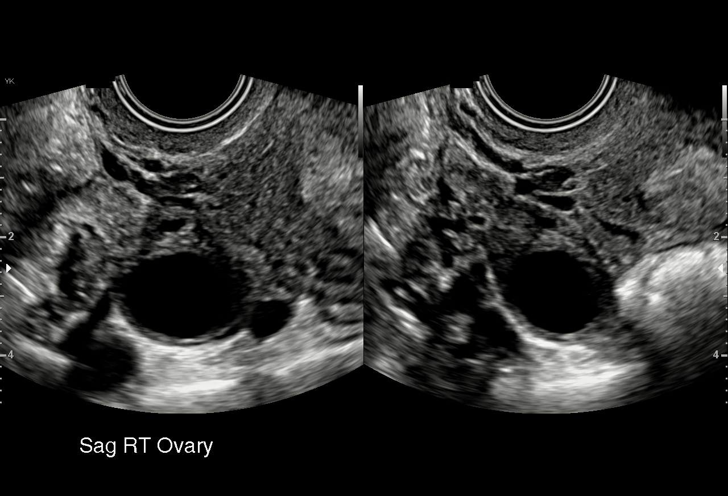
[im 32/38]
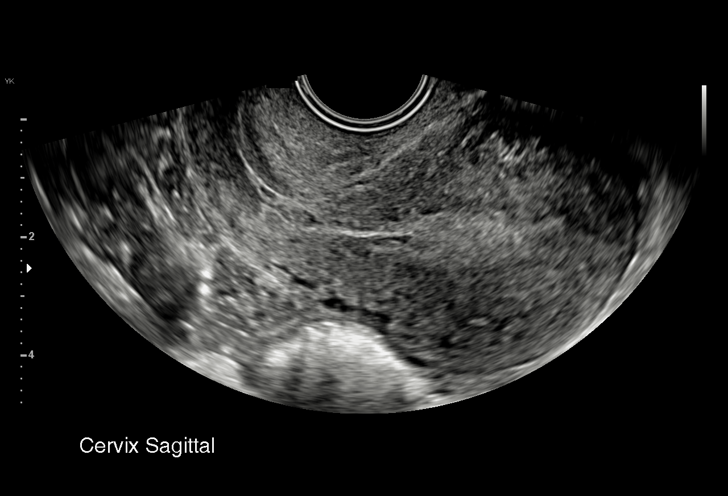
[im 35/38]
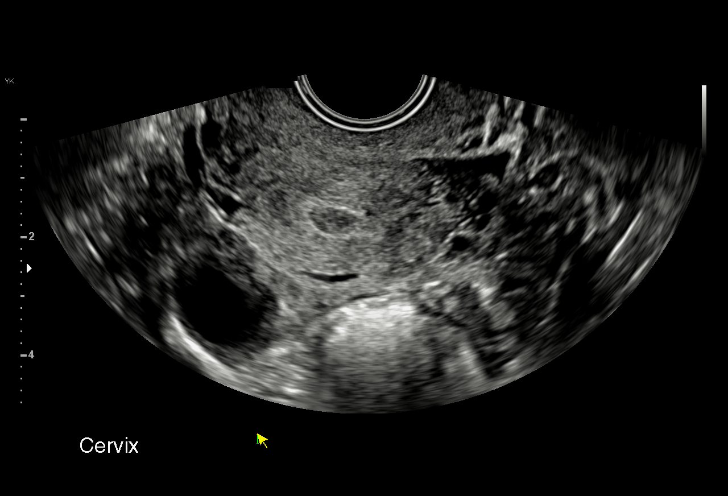
[im 38/38]
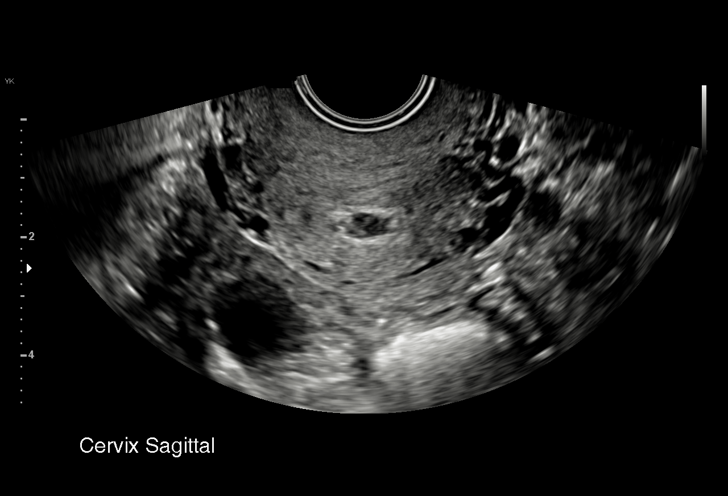

[15 of 28 positions shown; findings below may reference images not displayed]

FINDINGS: Intrauterine gestational sac: None

Maternal uterus/adnexae: Minimal fluid or blood noted in the
endocervical canal. No fibroids identified. Both ovaries are normal
appearance. No mass or abnormal free fluid identified.
IMPRESSION: Previous intrauterine gestational sac no longer visualized,
consistent with interval spontaneous abortion.

## 2020-02-15 NOTE — L&D Delivery Note (Signed)
Delivery Note Pt labored quickly to 8cm then to complete later. She pushed for and at 12:38 AM a viable female was delivered via Vaginal, Spontaneous (Presentation: Right Occiput Anterior).  APGAR: 9, 9; weight pending Anterior and posterior shoulders delivered spontaneously with body easily following. Baby placed on mothers abdomen. After a minute delay, the cord was clamped and cut by FOB.  Cord blood was obtained.   Placenta status: Spontaneous, Intact. schultz  Cord: 3 vessels with the following complications: None.  Cord pH: n/a  Anesthesia: None Episiotomy: None Lacerations: None Suture Repair:  n/a Est. Blood Loss (mL): 50  Mom to postpartum.  Baby to Couplet care / Skin to Skin.  Donna Hebert 12/28/2020, 12:52 AM

## 2020-06-09 LAB — OB RESULTS CONSOLE GC/CHLAMYDIA
Chlamydia: NEGATIVE
Gonorrhea: NEGATIVE

## 2020-06-09 LAB — OB RESULTS CONSOLE RUBELLA ANTIBODY, IGM: Rubella: IMMUNE

## 2020-06-09 LAB — OB RESULTS CONSOLE HIV ANTIBODY (ROUTINE TESTING): HIV: NONREACTIVE

## 2020-06-09 LAB — OB RESULTS CONSOLE HEPATITIS B SURFACE ANTIGEN: Hepatitis B Surface Ag: NEGATIVE

## 2020-06-09 LAB — HEPATITIS C ANTIBODY: HCV Ab: NEGATIVE

## 2020-12-07 LAB — OB RESULTS CONSOLE GBS: GBS: NEGATIVE

## 2020-12-27 ENCOUNTER — Other Ambulatory Visit: Payer: Self-pay

## 2020-12-27 ENCOUNTER — Inpatient Hospital Stay (HOSPITAL_COMMUNITY)
Admission: AD | Admit: 2020-12-27 | Discharge: 2020-12-29 | DRG: 807 | Disposition: A | Discharge status: Home / Self Care | Payer: Medicaid Other | Attending: Obstetrics and Gynecology | Admitting: Obstetrics and Gynecology

## 2020-12-27 ENCOUNTER — Encounter (HOSPITAL_COMMUNITY): Payer: Self-pay | Admitting: Obstetrics and Gynecology

## 2020-12-27 DIAGNOSIS — O4202 Full-term premature rupture of membranes, onset of labor within 24 hours of rupture: Secondary | ICD-10-CM

## 2020-12-27 DIAGNOSIS — Z3A37 37 weeks gestation of pregnancy: Secondary | ICD-10-CM | POA: Diagnosis not present

## 2020-12-27 DIAGNOSIS — O26893 Other specified pregnancy related conditions, third trimester: Secondary | ICD-10-CM | POA: Diagnosis present

## 2020-12-27 DIAGNOSIS — Z20822 Contact with and (suspected) exposure to covid-19: Secondary | ICD-10-CM | POA: Diagnosis present

## 2020-12-27 LAB — CBC
HCT: 41.2 % (ref 36.0–46.0)
Hemoglobin: 13 g/dL (ref 12.0–15.0)
MCH: 29 pg (ref 26.0–34.0)
MCHC: 31.6 g/dL (ref 30.0–36.0)
MCV: 91.8 fL (ref 80.0–100.0)
Platelets: 227 10*3/uL (ref 150–400)
RBC: 4.49 MIL/uL (ref 3.87–5.11)
RDW: 13.8 % (ref 11.5–15.5)
WBC: 10.3 10*3/uL (ref 4.0–10.5)
nRBC: 0 % (ref 0.0–0.2)

## 2020-12-27 LAB — TYPE AND SCREEN
ABO/RH(D): O POS
Antibody Screen: NEGATIVE

## 2020-12-27 LAB — RESP PANEL BY RT-PCR (FLU A&B, COVID) ARPGX2
Influenza A by PCR: NEGATIVE
Influenza B by PCR: NEGATIVE
SARS Coronavirus 2 by RT PCR: NEGATIVE

## 2020-12-27 LAB — POCT FERN TEST: POCT Fern Test: POSITIVE

## 2020-12-27 MED ORDER — LIDOCAINE HCL (PF) 1 % IJ SOLN: 30.0000 mL | INTRAMUSCULAR | Status: DC | PRN

## 2020-12-27 MED ORDER — ONDANSETRON HCL 4 MG/2ML IJ SOLN: 4.0000 mg | Freq: Four times a day (QID) | INTRAMUSCULAR | Status: DC | PRN

## 2020-12-27 MED ORDER — FLEET ENEMA 7-19 GM/118ML RE ENEM: 1.0000 | ENEMA | RECTAL | Status: DC | PRN

## 2020-12-27 MED ORDER — OXYTOCIN-SODIUM CHLORIDE 30-0.9 UT/500ML-% IV SOLN
2.5000 [IU]/h | INTRAVENOUS | Status: DC
  Administered 2020-12-28: 2.5 [IU]/h via INTRAVENOUS
  Filled 2020-12-27: qty 500

## 2020-12-27 MED ORDER — ACETAMINOPHEN 325 MG PO TABS: 650.0000 mg | ORAL_TABLET | ORAL | Status: DC | PRN

## 2020-12-27 MED ORDER — OXYTOCIN BOLUS FROM INFUSION
333.0000 mL | Freq: Once | INTRAVENOUS | Status: AC
  Administered 2020-12-28: 333 mL via INTRAVENOUS

## 2020-12-27 MED ORDER — OXYTOCIN-SODIUM CHLORIDE 30-0.9 UT/500ML-% IV SOLN
1.0000 m[IU]/min | INTRAVENOUS | Status: DC
  Administered 2020-12-27: 2 m[IU]/min via INTRAVENOUS

## 2020-12-27 MED ORDER — LACTATED RINGERS IV SOLN: 500.0000 mL | INTRAVENOUS | Status: DC | PRN

## 2020-12-27 MED ORDER — LACTATED RINGERS IV SOLN: INTRAVENOUS | Status: DC

## 2020-12-27 MED ORDER — SOD CITRATE-CITRIC ACID 500-334 MG/5ML PO SOLN: 30.0000 mL | ORAL | Status: DC | PRN

## 2020-12-27 MED ORDER — TERBUTALINE SULFATE 1 MG/ML IJ SOLN: 0.2500 mg | Freq: Once | INTRAMUSCULAR | Status: DC | PRN

## 2020-12-27 MED ORDER — OXYCODONE-ACETAMINOPHEN 5-325 MG PO TABS: 1.0000 | ORAL_TABLET | ORAL | Status: DC | PRN

## 2020-12-27 MED ORDER — OXYCODONE-ACETAMINOPHEN 5-325 MG PO TABS: 2.0000 | ORAL_TABLET | ORAL | Status: DC | PRN

## 2020-12-27 NOTE — MAU Provider Note (Signed)
S: [name] is a 25 y.o. [phone] at [redacted]w[redacted]d  who presents to MAU today complaining of leaking of fluid since 1600. She denies vaginal bleeding. She denies contractions. She reports normal fetal movement.    O: BP 121/66 (BP Location: Right Arm)   Pulse 79   Temp (!) 97.4 F (36.3 C) (Oral)   Resp 16   Ht 5\' 4"  (1.626 m)   Wt 68.4 kg   SpO2 100%   BMI 25.90 kg/m  GENERAL: Well-developed, well-nourished female in no acute distress.  HEAD: Normocephalic, atraumatic.  CHEST: Normal effort of breathing, regular heart rate ABDOMEN: Soft, nontender, gravid PELVIC: deferred, patient grossly ruptured   Fetal Monitoring: Baseline: 135 Variability: moderate Accelerations: 15x15 Decelerations: none Contractions: none  A: SIUP at [redacted]w[redacted]d  SROM  P: Report given to RN to contact MD on call for further instructions  [redacted]w[redacted]d, CNM 12/27/2020 6:13 PM

## 2020-12-27 NOTE — MAU Note (Signed)
Donna Hebert is a 25 y.o. at [redacted]w[redacted]d here in MAU reporting: LOF since about 4pm. States large gush of fluid and is still leaking. Fluid is clear. No bleeding. Having mild contractions about every 8 min. +FM  Onset of complaint: today  Pain score: 0/10  Vitals:   12/27/20 1807  BP: 121/66  Pulse: 79  Resp: 16  Temp: (!) 97.4 F (36.3 C)  SpO2: 100%     FHT: +FM, EFM applied in room  Lab orders placed from triage: none

## 2020-12-27 NOTE — H&P (Addendum)
Donna Hebert is a 25 y.o.G66P0111 female presenting in active labor at 78 6/[redacted]wks gestation. Pt reported painful contractions and leakage of fluid. Noted to have SROM - clear in MAU cervix at 5cm dil. ( Was 4cm in office).  Pt hoping for non medicated labor She is dated per LMP which was confirmed with a 9week [place]. Her pregnancy has been complicated by dilated fetal kidneys bilaterally - resolved at 28weeks. She has a history of a 35 week preterm delivery after PPROM. She is GBS neg and VZNI.  OB History     Gravida  3   Para  1   Term      Preterm  1   AB  1   Living  1      SAB  1   IAB      Ectopic      Multiple  0   Live Births  1          Past Medical History:  Diagnosis Date   Pregnant    SVD (spontaneous vaginal delivery) 12/13/2018   Syncope    Past Surgical History:  Procedure Laterality Date   NO PAST SURGERIES     Family History: family history includes Kidney disease in her mother; Thyroid cancer in her maternal grandmother. Social History:  reports that she has never smoked. She has never used smokeless tobacco. She reports that she does not drink alcohol and does not use drugs.     Maternal Diabetes: No Genetic Screening: Normal Maternal Ultrasounds/Referrals: Normal Fetal Ultrasounds or other Referrals:  None Maternal Substance Abuse:  No Significant Maternal Medications:  None Significant Maternal Lab Results:  Group B Strep negative Other Comments:  None  Review of Systems  Constitutional:  Positive for activity change. Negative for fatigue.  Eyes:  Negative for photophobia.  Respiratory:  Negative for chest tightness and shortness of breath.   Cardiovascular:  Positive for leg swelling. Negative for chest pain and palpitations.  Genitourinary:  Positive for pelvic pain. Negative for vaginal bleeding.  Musculoskeletal:  Positive for myalgias.  Neurological:  Negative for light-headedness and numbness.  Psychiatric/Behavioral:  The  patient is not nervous/anxious.   Maternal Medical History:  Reason for admission: Rupture of membranes and contractions.   Contractions: Onset was 3-5 hours ago.   Perceived severity is moderate.   Fetal activity: Perceived fetal activity is normal.   Prenatal complications: no prenatal complications Prenatal Complications - Diabetes: none.  Dilation: 6 Effacement (%): 60 Station: -3 Exam by:: R. Clelia Croft, RN/ [place] Blood pressure (!) 106/50, pulse 66, temperature 97.8 F (36.6 C), temperature source Oral, resp. rate 17, height 5\' 4"  (1.626 m), weight 68.4 kg, SpO2 100 %, unknown if currently breastfeeding. Maternal Exam:  Uterine Assessment: Contraction strength is moderate.  Contraction frequency is regular.  Abdomen: Patient reports generalized tenderness.  Estimated fetal weight is AGA.   Fetal presentation: vertex Introitus: Normal vulva. Vulva is negative for condylomata and lesion.  Normal vagina.  Vagina is negative for condylomata.  Amniotic fluid character: clear. Pelvis: adequate for delivery.   Cervix: Cervix evaluated by digital exam.     Fetal Exam Fetal Monitor Review: Baseline rate: 145.  Variability: moderate (6-25 bpm).   Pattern: accelerations present and no decelerations.   Fetal State Assessment: Category I - tracings are normal.  Physical Exam Vitals and nursing note reviewed. Exam conducted with a chaperone present.  Constitutional:      Appearance: Normal appearance. She is normal weight.  Cardiovascular:     Pulses: Normal pulses.  Abdominal:     Tenderness: There is generalized abdominal tenderness.  Genitourinary:    General: Normal vulva.  Vulva is no lesion.  Musculoskeletal:        General: Normal range of motion.     Cervical back: Normal range of motion.  Skin:    General: Skin is warm and dry.     Capillary Refill: Capillary refill takes 2 to 3 seconds.  Neurological:     General: No focal deficit present.     Mental Status: She is  alert and oriented to person, place, and time. Mental status is at baseline.  Psychiatric:        Mood and Affect: Mood normal.        Behavior: Behavior normal.        Thought Content: Thought content normal.        Judgment: Judgment normal.    Prenatal labs: ABO, Rh: --/--/O POS (11/13 1820) Antibody: NEG (11/13 1820) Rubella:   RPR:    HBsAg:    HIV:    GBS:     Assessment/Plan: 38YD [id number] female here with SROM and in active labor - Admitted for labor - Augmentation with pitocin  - GBS neg - Anticipate svd - Pain control per pt request    Janean Sark Ronnett Pullin 12/27/2020, 9:23 PM

## 2020-12-27 NOTE — Progress Notes (Addendum)
Patient ID: Donna Hebert, female   DOB: 08/30/1995, 25 y.o.   MRN: [id number] I was informed by nurse that pt reported increased pelvic pressure and was checked but exam was concerning due to high station and was uncertain if palpating vertex  I performed a bedside US and confirmed vertex I noted cervix to be 6.5/90/-2  Pt having prolonged contractions up to long on of pitocin   Stopped pitocin at this time  EFM 150s, cat 1  Offered iv pain meds ; pt will consider. Still declines epidural

## 2020-12-28 ENCOUNTER — Encounter (HOSPITAL_COMMUNITY): Payer: Self-pay | Admitting: Obstetrics and Gynecology

## 2020-12-28 LAB — CBC
HCT: 38.6 % (ref 36.0–46.0)
Hemoglobin: 12.7 g/dL (ref 12.0–15.0)
MCH: 29.4 pg (ref 26.0–34.0)
MCHC: 32.9 g/dL (ref 30.0–36.0)
MCV: 89.4 fL (ref 80.0–100.0)
Platelets: 200 10*3/uL (ref 150–400)
RBC: 4.32 MIL/uL (ref 3.87–5.11)
RDW: 13.6 % (ref 11.5–15.5)
WBC: 20.2 10*3/uL — ABNORMAL HIGH (ref 4.0–10.5)
nRBC: 0 % (ref 0.0–0.2)

## 2020-12-28 MED ORDER — DIBUCAINE (PERIANAL) 1 % EX OINT: 1.0000 "application " | TOPICAL_OINTMENT | CUTANEOUS | Status: DC | PRN

## 2020-12-28 MED ORDER — SIMETHICONE 80 MG PO CHEW: 80.0000 mg | CHEWABLE_TABLET | ORAL | Status: DC | PRN

## 2020-12-28 MED ORDER — PRENATAL MULTIVITAMIN CH
1.0000 | ORAL_TABLET | Freq: Every day | ORAL | Status: DC
  Administered 2020-12-28 – 2020-12-29 (×2): 1 via ORAL
  Filled 2020-12-28 (×3): qty 1

## 2020-12-28 MED ORDER — LACTATED RINGERS IV SOLN: INTRAVENOUS | Status: DC

## 2020-12-28 MED ORDER — ZOLPIDEM TARTRATE 5 MG PO TABS: 5.0000 mg | ORAL_TABLET | Freq: Every evening | ORAL | Status: DC | PRN

## 2020-12-28 MED ORDER — OXYCODONE HCL 5 MG PO TABS: 10.0000 mg | ORAL_TABLET | ORAL | Status: DC | PRN

## 2020-12-28 MED ORDER — IBUPROFEN 600 MG PO TABS
600.0000 mg | ORAL_TABLET | Freq: Four times a day (QID) | ORAL | Status: DC
  Administered 2020-12-28 – 2020-12-29 (×5): 600 mg via ORAL
  Filled 2020-12-28 (×6): qty 1

## 2020-12-28 MED ORDER — BENZOCAINE-MENTHOL 20-0.5 % EX AERO: 1.0000 "application " | INHALATION_SPRAY | CUTANEOUS | Status: DC | PRN

## 2020-12-28 MED ORDER — ONDANSETRON HCL 4 MG/2ML IJ SOLN: 4.0000 mg | INTRAMUSCULAR | Status: DC | PRN

## 2020-12-28 MED ORDER — SENNOSIDES-DOCUSATE SODIUM 8.6-50 MG PO TABS
2.0000 | ORAL_TABLET | Freq: Every day | ORAL | Status: DC
  Administered 2020-12-29: 2 via ORAL
  Filled 2020-12-28: qty 2

## 2020-12-28 MED ORDER — WITCH HAZEL-GLYCERIN EX PADS: 1.0000 "application " | MEDICATED_PAD | CUTANEOUS | Status: DC | PRN

## 2020-12-28 MED ORDER — ONDANSETRON HCL 4 MG PO TABS: 4.0000 mg | ORAL_TABLET | ORAL | Status: DC | PRN

## 2020-12-28 MED ORDER — COCONUT OIL OIL: 1.0000 "application " | TOPICAL_OIL | Status: DC | PRN

## 2020-12-28 MED ORDER — ACETAMINOPHEN 325 MG PO TABS
650.0000 mg | ORAL_TABLET | ORAL | Status: DC | PRN
  Administered 2020-12-28 (×2): 650 mg via ORAL
  Filled 2020-12-28 (×2): qty 2

## 2020-12-28 MED ORDER — DIPHENHYDRAMINE HCL 25 MG PO CAPS: 25.0000 mg | ORAL_CAPSULE | Freq: Four times a day (QID) | ORAL | Status: DC | PRN

## 2020-12-28 MED ORDER — TETANUS-DIPHTH-ACELL PERTUSSIS 5-2.5-18.5 LF-MCG/0.5 IM SUSY: 0.5000 mL | PREFILLED_SYRINGE | Freq: Once | INTRAMUSCULAR | Status: DC

## 2020-12-28 MED ORDER — OXYCODONE HCL 5 MG PO TABS: 5.0000 mg | ORAL_TABLET | ORAL | Status: DC | PRN

## 2020-12-28 NOTE — Lactation Note (Signed)
This note was copied from a baby's chart. Lactation Consultation Note  Patient Name: Donna Hebert Date: 12/28/2020   Age:26 hours LC talked with RN, Alfonzo Feller, Mom declined LC services at this time.  Maternal Data    Feeding    LATCH Score                    Lactation Tools Discussed/Used    Interventions    Discharge    Consult Status      Aleyssa Pike  Nicholson-Springer 12/28/2020, 3:12 PM

## 2020-12-28 NOTE — Progress Notes (Signed)
PPD #0 No problems Afeb, VSS Fundus firm, NT at U-1 Continue routine postpartum care 

## 2020-12-29 MED ORDER — IBUPROFEN 600 MG PO TABS
600.0000 mg | ORAL_TABLET | Freq: Four times a day (QID) | ORAL | 0 refills | Status: AC
Start: 2020-12-29 — End: ?

## 2020-12-29 NOTE — Progress Notes (Signed)
Patient is eating, ambulating, voiding.  Pain control is good.  Appropriate lochia, no complaints.  Vitals:   12/28/20 1515 12/28/20 2047 12/29/20 0545 12/29/20 1500  BP: (!) 102/56 (!) 115/56 119/64 119/69  Pulse: 74 72 74 70  Resp: 18 17 18 18   Temp: 98.4 F (36.9 C) 98.2 F (36.8 C) 98.3 F (36.8 C) 98.1 F (36.7 C)  TempSrc: Oral Oral Oral Oral  SpO2:  98%    Weight:      Height:        Fundus firm Abd nontender Ext: no calf tenderness  Lab Results  Component Value Date   WBC 20.2 (H) 12/28/2020   HGB 12.7 12/28/2020   HCT 38.6 12/28/2020   MCV 89.4 12/28/2020   PLT 200 12/28/2020    --/--/O POS (11/13 1820)  A/P Post partum day 1. Doing well, patient considering going home, but baby has add'l testing to complete.  Routine care.   01-25-1976

## 2020-12-29 NOTE — Discharge Summary (Signed)
Postpartum Discharge Summary   Patient Name: Donna Hebert DOB: 1996-01-21 MRN: [id number]  Date of admission: 12/27/2020 Delivery date:12/28/2020  Delivering provider: Sherlyn Hay  Date of discharge: 12/29/2020  Admitting diagnosis: Normal labor and delivery [O80] Intrauterine pregnancy: [redacted]w[redacted]d    Secondary diagnosis:  Active Problems:   Normal labor and delivery  Additional problems: none    Discharge diagnosis: Term Pregnancy Delivered                                              Post partum procedures: none Augmentation: Pitocin Complications: None  Hospital course: Induction of Labor With Vaginal Delivery   25y.o. yo [id number] admitted to the hospital 12/27/2020 for labor.   Patient had an uncomplicated labor course as follows: Membrane Rupture Time/Date: 4:00 PM ,12/27/2020   Delivery Method:Vaginal, Spontaneous  Episiotomy: None  Lacerations:  None  Details of delivery can be found in separate delivery note.  Patient had a routine postpartum course. Patient is discharged home 12/29/20.  Newborn Data: Birth date:12/28/2020  Birth time:12:38 AM  Gender:Female  Living status:Living  Apgars:9 ,9  Weight:3090 g   Magnesium Sulfate received: No BMZ received: No Rhophylac:N/A MMR:N/A T-DaP: see office note Flu: N/A Transfusion:No  Physical exam  Vitals:   12/28/20 1515 12/28/20 2047 12/29/20 0545 12/29/20 1500  BP: (!) 102/56 (!) 115/56 119/64 119/69  Pulse: 74 72 74 70  Resp: '18 17 18 18  ' Temp: 98.4 F (36.9 C) 98.2 F (36.8 C) 98.3 F (36.8 C) 98.1 F (36.7 C)  TempSrc: Oral Oral Oral Oral  SpO2:  98%    Weight:      Height:       General: alert, cooperative, and no distress Lochia: appropriate Uterine Fundus: firm Incision: N/A DVT Evaluation: No evidence of DVT seen on physical exam. Labs: Lab Results  Component Value Date   WBC 20.2 (H) 12/28/2020   HGB 12.7 12/28/2020   HCT 38.6 12/28/2020   MCV 89.4  12/28/2020   PLT 200 12/28/2020   CMP Latest Ref Rng & Units 07/06/2011  Glucose 70 - 99 mg/dL 87  BUN 6 - 23 mg/dL 12  Creatinine 0.47 - 1.00 mg/dL 0.68  Sodium 135 - 145 mEq/L 137  Potassium 3.5 - 5.1 mEq/L 3.8  Chloride 96 - 112 mEq/L 105  CO2 19 - 32 mEq/L 20  Calcium 8.4 - 10.5 mg/dL 8.9  Total Protein 6.0 - 8.3 g/dL 6.9  Total Bilirubin 0.3 - 1.2 mg/dL 0.2(L)  Alkaline Phos 47 - 119 U/L 71  AST 0 - 37 U/L 19  ALT 0 - 35 U/L 10   Edinburgh Score: Edinburgh Postnatal Depression Scale Screening Tool 12/29/2020  I have been able to laugh and see the funny side of things. 0  I have looked forward with enjoyment to things. 0  I have blamed myself unnecessarily when things went wrong. 0  I have been anxious or worried for no good reason. 0  I have felt scared or panicky for no good reason. 0  Things have been getting on top of me. 0  I have been so unhappy that I have had difficulty sleeping. 0  I have felt sad or miserable. 0  I have been so unhappy that I have been crying. 0  The thought of harming myself has  occurred to me. 0  Edinburgh Postnatal Depression Scale Total 0      After visit meds:  Allergies as of 12/29/2020       Reactions   Penicillins Hives, Swelling   Did it involve swelling of the face/tongue/throat, SOB, or low BP? Unknown Did it involve sudden or severe rash/hives, skin peeling, or any reaction on the inside of your mouth or nose? Unknown Did you need to seek medical attention at a hospital or doctor's office? Unknown When did it last happen?      25yo If all above answers are "NO", may proceed with cephalosporin use.        Medication List     TAKE these medications    acetaminophen 325 MG tablet Commonly known as: Tylenol Take 2 tablets (650 mg total) by mouth every 4 (four) hours as needed (for pain scale < 4).   ibuprofen 600 MG tablet Commonly known as: ADVIL Take 1 tablet (600 mg total) by mouth every 6 (six) hours.    multivitamin-prenatal 27-0.8 MG Tabs tablet Take 1 tablet by mouth daily at 12 noon.         Discharge home in stable condition Infant Feeding:  see peds note Infant Disposition:home with mother Discharge instruction: per After Visit Summary and Postpartum booklet. Activity: Advance as tolerated. Pelvic rest for 6 weeks.  Diet: routine diet Anticipated Birth Control: Unsure Postpartum Appointment:4 weeks Additional Postpartum F/U: Postpartum Depression checkup Future Appointments:No future appointments. Follow up Visit:  [place], DO Follow up in 4 week(s).   Specialty: Obstetrics and Gynecology Contact information: [address] [place] [address] [phone]                     12/29/2020 Allyn Kenner, DO

## 2020-12-29 NOTE — Social Work (Signed)
MOB was referred for history of panic attacks.   * Referral screened out by Clinical Social Worker because none of the following criteria appear to apply: ~ History of panic attacks during this pregnancy, or of post-partum depression following prior delivery. ~ Diagnosis of anxiety and/or depression within last 3 years. Per chart review, onset of panic attacks was during highschool and none since then.  OR * MOB's symptoms currently being treated with medication and/or therapy.  Please contact the Clinical Social Worker if needs arise, by [facility] request, or if MOB scores greater than 9/yes to question 10 on Edinburgh Postpartum Depression Screen.   Vivi Barrack, MSW, LCSW [facility]  Clinical Social Worker  [phone] September 24, 2020  8:33 AM

## 2021-01-08 ENCOUNTER — Telehealth (HOSPITAL_COMMUNITY): Payer: Self-pay | Admitting: *Deleted

## 2021-01-08 NOTE — Telephone Encounter (Signed)
Phone voicemail message left to return nurse call.  Duffy Rhody, RN 01-08-2021 at 11:32am

## 2021-01-11 ENCOUNTER — Inpatient Hospital Stay (HOSPITAL_COMMUNITY): Admit: 2021-01-11 | Payer: Self-pay

## 2021-12-06 ENCOUNTER — Ambulatory Visit: Payer: Self-pay | Attending: Internal Medicine | Admitting: Internal Medicine
